# Patient Record
Sex: Female | Born: 1955 | Race: White | Hispanic: No | Smoking: Never smoker
Health system: Southern US, Community
[De-identification: ages and names within clinical notes are randomized; demographics above are authoritative.]

## PROBLEM LIST (undated history)

## (undated) DIAGNOSIS — K219 Gastro-esophageal reflux disease without esophagitis: Secondary | ICD-10-CM

## (undated) DIAGNOSIS — M1990 Unspecified osteoarthritis, unspecified site: Secondary | ICD-10-CM

## (undated) DIAGNOSIS — M62838 Other muscle spasm: Secondary | ICD-10-CM

## (undated) DIAGNOSIS — R51 Headache: Secondary | ICD-10-CM

## (undated) DIAGNOSIS — R519 Headache, unspecified: Secondary | ICD-10-CM

## (undated) DIAGNOSIS — E041 Nontoxic single thyroid nodule: Secondary | ICD-10-CM

## (undated) HISTORY — PX: TUBAL LIGATION: SHX77

## (undated) HISTORY — DX: Gastro-esophageal reflux disease without esophagitis: K21.9

## (undated) HISTORY — DX: Other muscle spasm: M62.838

## (undated) HISTORY — PX: OTHER SURGICAL HISTORY: SHX169

## (undated) HISTORY — PX: JOINT REPLACEMENT: SHX530

---

## 1997-02-07 HISTORY — PX: CORONARY ARTERY BYPASS GRAFT: SHX141

## 1997-02-07 HISTORY — PX: CHOLECYSTECTOMY: SHX55

## 1998-02-07 DIAGNOSIS — M539 Dorsopathy, unspecified: Secondary | ICD-10-CM | POA: Insufficient documentation

## 2004-05-05 ENCOUNTER — Ambulatory Visit: Payer: Self-pay | Admitting: Specialist

## 2004-05-19 ENCOUNTER — Ambulatory Visit: Payer: Self-pay | Admitting: Anesthesiology

## 2004-06-15 ENCOUNTER — Ambulatory Visit: Payer: Self-pay | Admitting: Anesthesiology

## 2004-07-28 ENCOUNTER — Ambulatory Visit: Payer: Self-pay | Admitting: Anesthesiology

## 2004-09-16 ENCOUNTER — Ambulatory Visit: Payer: Self-pay | Admitting: Obstetrics and Gynecology

## 2005-09-20 ENCOUNTER — Ambulatory Visit: Payer: Self-pay | Admitting: Obstetrics and Gynecology

## 2006-08-23 DIAGNOSIS — R209 Unspecified disturbances of skin sensation: Secondary | ICD-10-CM | POA: Insufficient documentation

## 2007-12-27 DIAGNOSIS — J329 Chronic sinusitis, unspecified: Secondary | ICD-10-CM | POA: Insufficient documentation

## 2007-12-27 DIAGNOSIS — G4700 Insomnia, unspecified: Secondary | ICD-10-CM | POA: Insufficient documentation

## 2009-03-26 DIAGNOSIS — L209 Atopic dermatitis, unspecified: Secondary | ICD-10-CM | POA: Insufficient documentation

## 2009-03-26 DIAGNOSIS — H6990 Unspecified Eustachian tube disorder, unspecified ear: Secondary | ICD-10-CM | POA: Insufficient documentation

## 2009-07-14 DIAGNOSIS — J309 Allergic rhinitis, unspecified: Secondary | ICD-10-CM | POA: Insufficient documentation

## 2011-05-03 ENCOUNTER — Ambulatory Visit: Payer: Self-pay | Admitting: Obstetrics and Gynecology

## 2011-07-13 LAB — HM COLONOSCOPY

## 2012-09-05 LAB — HM PAP SMEAR: HM PAP: NEGATIVE

## 2013-07-23 DIAGNOSIS — M2240 Chondromalacia patellae, unspecified knee: Secondary | ICD-10-CM | POA: Insufficient documentation

## 2013-11-06 LAB — CBC AND DIFFERENTIAL
HCT: 43 % (ref 36–46)
Hemoglobin: 14.7 g/dL (ref 12.0–16.0)
Platelets: 292 K/µL (ref 150–399)
WBC: 4.9 10*3/mL

## 2013-11-06 LAB — BASIC METABOLIC PANEL WITH GFR
BUN: 17 mg/dL (ref 4–21)
Creatinine: 0.9 mg/dL (ref 0.5–1.1)
Glucose: 102 mg/dL
Potassium: 4.4 mmol/L (ref 3.4–5.3)
Sodium: 141 mmol/L (ref 137–147)

## 2013-11-06 LAB — HEPATIC FUNCTION PANEL
ALT: 15 U/L (ref 7–35)
AST: 23 U/L (ref 13–35)

## 2013-11-06 LAB — LIPID PANEL
Cholesterol: 199 mg/dL (ref 0–200)
HDL: 65 mg/dL (ref 35–70)
LDL Cholesterol: 116 mg/dL
Triglycerides: 91 mg/dL (ref 40–160)

## 2013-11-06 LAB — HEMOGLOBIN A1C: Hgb A1c MFr Bld: 5.8 % (ref 4.0–6.0)

## 2014-05-14 ENCOUNTER — Ambulatory Visit
Admit: 2014-05-14 | Disposition: A | Discharge status: Home / Self Care | Payer: Self-pay | Attending: Family Medicine | Admitting: Family Medicine

## 2014-06-09 ENCOUNTER — Other Ambulatory Visit: Payer: Self-pay | Admitting: Orthopedic Surgery

## 2014-06-09 DIAGNOSIS — M542 Cervicalgia: Secondary | ICD-10-CM

## 2014-06-13 LAB — TSH: TSH: 0.81 u[IU]/mL (ref <=5.90)

## 2014-06-17 ENCOUNTER — Ambulatory Visit: Payer: Self-pay

## 2014-06-18 ENCOUNTER — Other Ambulatory Visit: Payer: Self-pay | Admitting: Family Medicine

## 2014-06-18 DIAGNOSIS — E041 Nontoxic single thyroid nodule: Secondary | ICD-10-CM

## 2014-06-20 ENCOUNTER — Ambulatory Visit: Payer: Self-pay

## 2014-06-25 ENCOUNTER — Ambulatory Visit
Admission: RE | Admit: 2014-06-25 | Discharge: 2014-06-25 | Disposition: A | Discharge status: Home / Self Care | Payer: BLUE CROSS/BLUE SHIELD | Source: Ambulatory Visit | Attending: Family Medicine | Admitting: Family Medicine

## 2014-06-25 DIAGNOSIS — E041 Nontoxic single thyroid nodule: Secondary | ICD-10-CM | POA: Diagnosis not present

## 2014-06-30 ENCOUNTER — Other Ambulatory Visit: Payer: Self-pay | Admitting: Otolaryngology

## 2014-06-30 DIAGNOSIS — E041 Nontoxic single thyroid nodule: Secondary | ICD-10-CM

## 2014-07-04 ENCOUNTER — Other Ambulatory Visit: Payer: Self-pay | Admitting: Physician Assistant

## 2014-07-08 ENCOUNTER — Ambulatory Visit
Admission: RE | Admit: 2014-07-08 | Discharge: 2014-07-08 | Disposition: A | Discharge status: Home / Self Care | Payer: BLUE CROSS/BLUE SHIELD | Source: Ambulatory Visit | Attending: Otolaryngology | Admitting: Otolaryngology

## 2014-07-08 DIAGNOSIS — R51 Headache: Secondary | ICD-10-CM | POA: Insufficient documentation

## 2014-07-08 DIAGNOSIS — E041 Nontoxic single thyroid nodule: Secondary | ICD-10-CM | POA: Diagnosis not present

## 2014-07-08 DIAGNOSIS — M1990 Unspecified osteoarthritis, unspecified site: Secondary | ICD-10-CM | POA: Diagnosis not present

## 2014-07-08 DIAGNOSIS — Z9049 Acquired absence of other specified parts of digestive tract: Secondary | ICD-10-CM | POA: Insufficient documentation

## 2014-07-08 HISTORY — DX: Headache, unspecified: R51.9

## 2014-07-08 HISTORY — DX: Nontoxic single thyroid nodule: E04.1

## 2014-07-08 HISTORY — DX: Headache: R51

## 2014-07-08 HISTORY — DX: Unspecified osteoarthritis, unspecified site: M19.90

## 2014-07-08 NOTE — Procedures (Signed)
FNA L lower pole nodule No comp

## 2014-07-11 LAB — CYTOLOGY - NON PAP

## 2014-07-27 ENCOUNTER — Other Ambulatory Visit: Payer: Self-pay | Admitting: Physician Assistant

## 2014-07-27 DIAGNOSIS — M545 Low back pain: Secondary | ICD-10-CM

## 2014-11-03 ENCOUNTER — Ambulatory Visit: Payer: BLUE CROSS/BLUE SHIELD | Admitting: Podiatry

## 2014-11-05 ENCOUNTER — Encounter: Payer: Self-pay | Admitting: Podiatry

## 2014-11-05 ENCOUNTER — Ambulatory Visit (INDEPENDENT_AMBULATORY_CARE_PROVIDER_SITE_OTHER): Payer: BLUE CROSS/BLUE SHIELD

## 2014-11-05 ENCOUNTER — Ambulatory Visit (INDEPENDENT_AMBULATORY_CARE_PROVIDER_SITE_OTHER): Payer: BLUE CROSS/BLUE SHIELD | Admitting: Podiatry

## 2014-11-05 VITALS — BP 113/78 | HR 83 | Resp 18

## 2014-11-05 DIAGNOSIS — R52 Pain, unspecified: Secondary | ICD-10-CM | POA: Diagnosis not present

## 2014-11-05 DIAGNOSIS — M778 Other enthesopathies, not elsewhere classified: Secondary | ICD-10-CM

## 2014-11-05 DIAGNOSIS — M7750 Other enthesopathy of unspecified foot: Secondary | ICD-10-CM | POA: Diagnosis not present

## 2014-11-05 DIAGNOSIS — M779 Enthesopathy, unspecified: Secondary | ICD-10-CM

## 2014-11-05 MED ORDER — MELOXICAM 15 MG PO TABS: 15.0000 mg | ORAL_TABLET | Freq: Every day | ORAL | Status: DC

## 2014-11-05 MED ORDER — METHYLPREDNISOLONE 4 MG PO TBPK: ORAL_TABLET | ORAL | Status: DC

## 2014-11-05 NOTE — Progress Notes (Signed)
   Subjective:    Patient ID: Ashley Wallace, female    DOB: 1955-08-12, 59 y.o.   MRN: [id number]  HPI I AM HAVING SOME PAIN ON BOTH OF MY FEET AND IT HAS BEEN GOING ON FOR ABOUT 6 MONTHS AND BURNS, THROBS, SORE AND TENDER AND NO SWELLING AND HURTS ON LEFT TOP AND BALL OF RIGHT HURTS AND TURNED A CARTWHEEL IN June AND HEARD A POP NEAR MY BUTT AND THE 2ND AND 3RD TOE SEPARATES ON THE LEFT FOOT. She states that the swelling continually gets worse and she has no stent increasing diastases between the third and fourth toes.    Review of Systems  All other systems reviewed and are negative.      Objective:   Physical Exam: 59 year old female presents in no acute distress vital signs stable alert and oriented 3. Pulses are strongly palpable bilateral. Neurologic sensorium is intact for Semmes-Weinstein monofilament. Deep tendon reflexes are intact bilateral muscle strength +5 over 5 dorsiflexion plantar flexors and inverters everters all digits musculature is intact. Orthopedic evaluation inserts all joints distal to the ankle for range of motion without crepitation. Cutaneous evaluation and states supple well-hydrated cutis no erythema edema cellulitis drainage or odor. She has a definite diastases with a palpable mass between the second third digits of the left foot. Medial deviation of the toe and lateral deviation of the third toe. She has pain with what appears to be fluctuance between the toes. The right foot is starting to develop some medial deviation as well. Radiographically she has what appears to be a dislocation of the third digit laterally. She also has medial deviation of the second digit with an elongated second metatarsal bilateral.        Assessment & Plan:  Capsulitis bilateral second metatarsophalangeal joint.  Plan: Discussed etiology pathology conservative versus surgical therapies. Started her on a Medrol Dosepak to be followed by meloxicam. Injected both feet at the  point of maximal tenderness. The right foot was injected with dexamethasone into the second metatarsophalangeal joint and the left was injected into the second interdigital space. We discussed appropriate shoe gear stretching exercises and ice therapy I will follow-up with her in 1 month.

## 2014-12-02 DIAGNOSIS — L309 Dermatitis, unspecified: Secondary | ICD-10-CM | POA: Insufficient documentation

## 2014-12-02 DIAGNOSIS — M62838 Other muscle spasm: Secondary | ICD-10-CM

## 2014-12-02 DIAGNOSIS — K219 Gastro-esophageal reflux disease without esophagitis: Secondary | ICD-10-CM

## 2014-12-02 DIAGNOSIS — R7303 Prediabetes: Secondary | ICD-10-CM | POA: Insufficient documentation

## 2014-12-02 DIAGNOSIS — W57XXXA Bitten or stung by nonvenomous insect and other nonvenomous arthropods, initial encounter: Secondary | ICD-10-CM | POA: Insufficient documentation

## 2014-12-02 DIAGNOSIS — B354 Tinea corporis: Secondary | ICD-10-CM | POA: Insufficient documentation

## 2014-12-02 DIAGNOSIS — M25519 Pain in unspecified shoulder: Secondary | ICD-10-CM | POA: Insufficient documentation

## 2014-12-02 DIAGNOSIS — E041 Nontoxic single thyroid nodule: Secondary | ICD-10-CM | POA: Insufficient documentation

## 2014-12-02 DIAGNOSIS — M539 Dorsopathy, unspecified: Secondary | ICD-10-CM | POA: Insufficient documentation

## 2014-12-02 HISTORY — DX: Other muscle spasm: M62.838

## 2014-12-02 HISTORY — DX: Gastro-esophageal reflux disease without esophagitis: K21.9

## 2014-12-04 ENCOUNTER — Ambulatory Visit (INDEPENDENT_AMBULATORY_CARE_PROVIDER_SITE_OTHER): Payer: BLUE CROSS/BLUE SHIELD | Admitting: Physician Assistant

## 2014-12-04 ENCOUNTER — Encounter: Payer: Self-pay | Admitting: Physician Assistant

## 2014-12-04 VITALS — BP 102/78 | HR 92 | Temp 98.3°F | Resp 16 | Ht 63.0 in | Wt 140.6 lb

## 2014-12-04 DIAGNOSIS — Z1239 Encounter for other screening for malignant neoplasm of breast: Secondary | ICD-10-CM | POA: Diagnosis not present

## 2014-12-04 DIAGNOSIS — G4700 Insomnia, unspecified: Secondary | ICD-10-CM | POA: Diagnosis not present

## 2014-12-04 DIAGNOSIS — E041 Nontoxic single thyroid nodule: Secondary | ICD-10-CM | POA: Diagnosis not present

## 2014-12-04 DIAGNOSIS — Z0000 Encounter for general adult medical examination without abnormal findings: Secondary | ICD-10-CM

## 2014-12-04 DIAGNOSIS — Z1211 Encounter for screening for malignant neoplasm of colon: Secondary | ICD-10-CM | POA: Diagnosis not present

## 2014-12-04 DIAGNOSIS — R7303 Prediabetes: Secondary | ICD-10-CM

## 2014-12-04 DIAGNOSIS — Z124 Encounter for screening for malignant neoplasm of cervix: Secondary | ICD-10-CM | POA: Diagnosis not present

## 2014-12-04 LAB — IFOBT (OCCULT BLOOD): IFOBT: NEGATIVE

## 2014-12-04 MED ORDER — ZOLPIDEM TARTRATE ER 6.25 MG PO TBCR: 6.2500 mg | EXTENDED_RELEASE_TABLET | Freq: Every evening | ORAL | Status: DC | PRN

## 2014-12-04 NOTE — Patient Instructions (Addendum)
Health Maintenance, Female Adopting a healthy lifestyle and getting preventive care can go a long way to promote health and wellness. Talk with your health care provider about what schedule of regular examinations is right for you. This is a good chance for you to check in with your provider about disease prevention and staying healthy. In between checkups, there are plenty of things you can do on your own. Experts have done a lot of research about which lifestyle changes and preventive measures are most likely to keep you healthy. Ask your health care provider for more information. WEIGHT AND DIET  Eat a healthy diet  Be sure to include plenty of vegetables, fruits, low-fat dairy products, and lean protein.  Do not eat a lot of foods high in solid fats, added sugars, or salt.  Get regular exercise. This is one of the most important things you can do for your health.  Most adults should exercise for at least 150 minutes each week. The exercise should increase your heart rate and make you sweat (moderate-intensity exercise).  Most adults should also do strengthening exercises at least twice a week. This is in addition to the moderate-intensity exercise.  Maintain a healthy weight  Body mass index (BMI) is a measurement that can be used to identify possible weight problems. It estimates body fat based on height and weight. Your health care provider can help determine your BMI and help you achieve or maintain a healthy weight.  For females 28 years of age and older:   A BMI below 18.5 is considered underweight.  A BMI of 18.5 to 24.9 is normal.  A BMI of 25 to 29.9 is considered overweight.  A BMI of 30 and above is considered obese.  Watch levels of cholesterol and blood lipids  You should start having your blood tested for lipids and cholesterol at 57 years of age, then have this test every 5 years.  You may need to have your cholesterol levels checked more often if:  Your lipid  or cholesterol levels are high.  You are older than 57 years of age.  You are at high risk for heart disease.  CANCER SCREENING   Lung Cancer  Lung cancer screening is recommended for adults 57-66 years old who are at high risk for lung cancer because of a history of smoking.  A yearly low-dose CT scan of the lungs is recommended for people who:  Currently smoke.  Have quit within the past 15 years.  Have at least a 30-pack-year history of smoking. A pack year is smoking an average of one pack of cigarettes a day for 1 year.  Yearly screening should continue until it has been 15 years since you quit.  Yearly screening should stop if you develop a health problem that would prevent you from having lung cancer treatment.  Breast Cancer  Practice breast self-awareness. This means understanding how your breasts normally appear and feel.  It also means doing regular breast self-exams. Let your health care provider know about any changes, no matter how small.  If you are in your 20s or 30s, you should have a clinical breast exam (CBE) by a health care provider every 1-3 years as part of a regular health exam.  If you are 25 or older, have a CBE every year. Also consider having a breast X-ray (mammogram) every year.  If you have a family history of breast cancer, talk to your health care provider about genetic screening.  If you  are at high risk for breast cancer, talk to your health care provider about having an MRI and a mammogram every year.  Breast cancer gene (BRCA) assessment is recommended for women who have family members with BRCA-related cancers. BRCA-related cancers include:  Breast.  Ovarian.  Tubal.  Peritoneal cancers.  Results of the assessment will determine the need for genetic counseling and BRCA1 and BRCA2 testing. Cervical Cancer Your health care provider may recommend that you be screened regularly for cancer of the pelvic organs (ovaries, uterus, and  vagina). This screening involves a pelvic examination, including checking for microscopic changes to the surface of your cervix (Pap test). You may be encouraged to have this screening done every 3 years, beginning at age 57.  For women ages 30-65, health care providers may recommend pelvic exams and Pap testing every 3 years, or they may recommend the Pap and pelvic exam, combined with testing for human papilloma virus (HPV), every 5 years. Some types of HPV increase your risk of cervical cancer. Testing for HPV may also be done on women of any age with unclear Pap test results.  Other health care providers may not recommend any screening for nonpregnant women who are considered low risk for pelvic cancer and who do not have symptoms. Ask your health care provider if a screening pelvic exam is right for you.  If you have had past treatment for cervical cancer or a condition that could lead to cancer, you need Pap tests and screening for cancer for at least 20 years after your treatment. If Pap tests have been discontinued, your risk factors (such as having a new sexual partner) need to be reassessed to determine if screening should resume. Some women have medical problems that increase the chance of getting cervical cancer. In these cases, your health care provider may recommend more frequent screening and Pap tests. Colorectal Cancer  This type of cancer can be detected and often prevented.  Routine colorectal cancer screening usually begins at 57 years of age and continues through 57 years of age.  Your health care provider may recommend screening at an earlier age if you have risk factors for colon cancer.  Your health care provider may also recommend using home test kits to check for hidden blood in the stool.  A small camera at the end of a tube can be used to examine your colon directly (sigmoidoscopy or colonoscopy). This is done to check for the earliest forms of colorectal  cancer.  Routine screening usually begins at age 50.  Direct examination of the colon should be repeated every 5-10 years through 57 years of age. However, you may need to be screened more often if early forms of precancerous polyps or small growths are found. Skin Cancer  Check your skin from head to toe regularly.  Tell your health care provider about any new moles or changes in moles, especially if there is a change in a mole's shape or color.  Also tell your health care provider if you have a mole that is larger than the size of a pencil eraser.  Always use sunscreen. Apply sunscreen liberally and repeatedly throughout the day.  Protect yourself by wearing long sleeves, pants, a wide-brimmed hat, and sunglasses whenever you are outside. HEART DISEASE, DIABETES, AND HIGH BLOOD PRESSURE   High blood pressure causes heart disease and increases the risk of stroke. High blood pressure is more likely to develop in:  People who have blood pressure in the high end   of the normal range (130-139/85-89 mm Hg).  People who are overweight or obese.  People who are African American.  If you are 38-23 years of age, have your blood pressure checked every 3-5 years. If you are 61 years of age or older, have your blood pressure checked every year. You should have your blood pressure measured twice--once when you are at a hospital or clinic, and once when you are not at a hospital or clinic. Record the average of the two measurements. To check your blood pressure when you are not at a hospital or clinic, you can use:  An automated blood pressure machine at a pharmacy.  A home blood pressure monitor.  If you are between 45 years and 39 years old, ask your health care provider if you should take aspirin to prevent strokes.  Have regular diabetes screenings. This involves taking a blood sample to check your fasting blood sugar level.  If you are at a normal weight and have a low risk for diabetes,  have this test once every three years after 57 years of age.  If you are overweight and have a high risk for diabetes, consider being tested at a younger age or more often. PREVENTING INFECTION  Hepatitis B  If you have a higher risk for hepatitis B, you should be screened for this virus. You are considered at high risk for hepatitis B if:  You were born in a country where hepatitis B is common. Ask your health care provider which countries are considered high risk.  Your parents were born in a high-risk country, and you have not been immunized against hepatitis B (hepatitis B vaccine).  You have HIV or AIDS.  You use needles to inject street drugs.  You live with someone who has hepatitis B.  You have had sex with someone who has hepatitis B.  You get hemodialysis treatment.  You take certain medicines for conditions, including cancer, organ transplantation, and autoimmune conditions. Hepatitis C  Blood testing is recommended for:  Everyone born from [birth year] through 1963.  Anyone with known risk factors for hepatitis C. Sexually transmitted infections (STIs)  You should be screened for sexually transmitted infections (STIs) including gonorrhea and chlamydia if:  You are sexually active and are younger than 57 years of age.  You are older than 57 years of age and your health care provider tells you that you are at risk for this type of infection.  Your sexual activity has changed since you were last screened and you are at an increased risk for chlamydia or gonorrhea. Ask your health care provider if you are at risk.  If you do not have HIV, but are at risk, it may be recommended that you take a prescription medicine daily to prevent HIV infection. This is called pre-exposure prophylaxis (PrEP). You are considered at risk if:  You are sexually active and do not regularly use condoms or know the HIV status of your partner(s).  You take drugs by injection.  You are sexually  active with a partner who has HIV. Talk with your health care provider about whether you are at high risk of being infected with HIV. If you choose to begin PrEP, you should first be tested for HIV. You should then be tested every 3 months for as long as you are taking PrEP.  PREGNANCY   If you are premenopausal and you may become pregnant, ask your health care provider about preconception counseling.  If you may  become pregnant, take 400 to 800 micrograms (mcg) of folic acid every day.  If you want to prevent pregnancy, talk to your health care provider about birth control (contraception). OSTEOPOROSIS AND MENOPAUSE   Osteoporosis is a disease in which the bones lose minerals and strength with aging. This can result in serious bone fractures. Your risk for osteoporosis can be identified using a bone density scan.  If you are 43 years of age or older, or if you are at risk for osteoporosis and fractures, ask your health care provider if you should be screened.  Ask your health care provider whether you should take a calcium or vitamin D supplement to lower your risk for osteoporosis.  Menopause may have certain physical symptoms and risks.  Hormone replacement therapy may reduce some of these symptoms and risks. Talk to your health care provider about whether hormone replacement therapy is right for you.  HOME CARE INSTRUCTIONS   Schedule regular health, dental, and eye exams.  Stay current with your immunizations.   Do not use any tobacco products including cigarettes, chewing tobacco, or electronic cigarettes.  If you are pregnant, do not drink alcohol.  If you are breastfeeding, limit how much and how often you drink alcohol.  Limit alcohol intake to no more than 1 drink per day for nonpregnant women. One drink equals 12 ounces of beer, 5 ounces of wine, or 1 ounces of hard liquor.  Do not use street drugs.  Do not share needles.  Ask your health care provider for help if  you need support or information about quitting drugs.  Tell your health care provider if you often feel depressed.  Tell your health care provider if you have ever been abused or do not feel safe at home.   This information is not intended to replace advice given to you by your health care provider. Make sure you discuss any questions you have with your health care provider.   Document Released: 08/09/2010 Document Revised: 02/14/2014 Document Reviewed: 12/26/2012 Elsevier Interactive Patient Education 2016 [employer]  Zolpidem extended-release tablets What is this medicine? ZOLPIDEM (zole PI dem) is used to treat insomnia. This medicine helps you to fall asleep and sleep through the night. This medicine may be used for other purposes; ask your health care provider or pharmacist if you have questions. What should I tell my health care provider before I take this medicine? They need to know if you have any of these conditions: -depression -history of drug abuse or addiction -if you often drink alcohol -liver disease -lung or breathing disease -myasthenia gravis -sleep apnea -suicidal thoughts, plans, or attempt; a previous suicide attempt by you or a family member -an unusual or allergic reaction to zolpidem, other medicines, foods, dyes, or preservatives -pregnant or trying to get pregnant -breast-feeding How should I use this medicine? Take this medicine by mouth with a glass of water. Follow the directions on the prescription label. Do not crush, split, or chew the tablet before swallowing. It is better to take this medicine on an empty stomach and only when you are ready for bed. Do not take your medicine more often than directed. If you have been taking this medicine for several weeks and suddenly stop taking it, you may get unpleasant withdrawal symptoms. Your doctor or health care professional may want to gradually reduce the dose. Do not stop taking this medicine on your own.  Always follow your doctor or health care professional's advice. A special MedGuide will  be given to you by the pharmacist with each prescription and refill. Be sure to read this information carefully each time. Talk to your pediatrician regarding the use of this medicine in children. Special care may be needed. Overdosage: If you think you have taken too much of this medicine contact a poison control center or emergency room at once. NOTE: This medicine is only for you. Do not share this medicine with others. What if I miss a dose? This does not apply. This medicine should only be taken immediately before going to sleep. Do not take double or extra doses. What may interact with this medicine? -alcohol -antihistamines for allergy, cough and cold -certain medicines for anxiety or sleep -certain medicines for depression, like amitriptyline, fluoxetine, sertraline -certain medicines for fungal infections like ketoconazole and itraconazole -certain medicines for seizures like phenobarbital, primidone -ciprofloxacin -dietary supplements for sleep, like valerian or kava kava -general anesthetics like halothane, isoflurane, methoxyflurane, propofol -local anesthetics like lidocaine, pramoxine, tetracaine -medicines that relax muscles for surgery -narcotic medicines for pain -phenothiazines like chlorpromazine, mesoridazine, prochlorperazine, thioridazine -rifampin This list may not describe all possible interactions. Give your health care provider a list of all the medicines, herbs, non-prescription drugs, or dietary supplements you use. Also tell them if you smoke, drink alcohol, or use illegal drugs. Some items may interact with your medicine. What should I watch for while using this medicine? Visit your doctor or health care professional for regular checks on your progress. Keep a regular sleep schedule by going to bed at about the same time each night. Avoid caffeine-containing drinks in the  evening hours. When sleep medicines are used every night for more than a few weeks, they may stop working. Talk to your doctor if your insomnia worsens or is not better within 7 to 10 days. After taking this medicine for sleep, you may get up out of bed while not being fully awake and do an activity that you do not know you are doing. The next morning, you may have no memory of the event. Activities such as driving a car ("sleep-driving"), making and eating food, talking on the phone, sexual activity, and sleep-walking have been reported. Call your doctor right away if you find out you have done any of these activities. Do not take this medicine if you have used alcohol that evening or before bed or taken another medicine for sleep since your risk of doing these sleep-related activities will be increased. Do not take this medicine unless you are able to stay in bed for a full night (7 to 8 hours) before you must be active again. Do not drive, use machinery, or do anything that needs mental alertness the day after you take this medicine. You may have a decrease in mental alertness the day after use, even if you feel that you are fully awake. Tell your doctor if you will need to perform activities requiring full alertness, such as driving, the next day. Do not stand or sit up quickly, especially if you are an older patient. This reduces the risk of dizzy or fainting spells. If you or your family notice any changes in your moods or behavior, such as new or worsening depression, thoughts of harming yourself, anxiety, other unusual or disturbing thoughts, or memory loss, call your doctor right away. After you stop taking this medicine, you may have trouble falling asleep. This is called rebound insomnia. This problem usually goes away on its own after 1 or 2 nights. What side  effects may I notice from receiving this medicine? Side effects that you should report to your doctor or health care professional as soon as  possible: -allergic reactions like skin rash, itching or hives, swelling of the face, lips, or tongue -breathing problems -changes in vision -confusion -depressed mood or other changes in moods or emotions -feeling faint or lightheaded, falls -hallucinations -loss of balance or coordination -loss of memory -restlessness, excitability, or feelings of anxiety or agitation -suicidal thoughts -unusual activities while asleep like driving, eating, making phone calls, or sexual activity Side effects that usually do not require medical attention (report to your doctor or health care professional if they continue or are bothersome): -dizziness -drowsiness the day after you take this medicine -headache This list may not describe all possible side effects. Call your doctor for medical advice about side effects. You may report side effects to FDA at 1-800-FDA-1088. Where should I keep my medicine? Keep out of the reach of children. This medicine can be abused. Keep your medicine in a safe place to protect it from theft. Do not share this medicine with anyone. Selling or giving away this medicine is dangerous and against the law. This medicine may cause accidental overdose and death if taken by other adults, children, or pets. Mix any unused medicine with a substance like cat litter or coffee grounds. Then throw the medicine away in a sealed container like a sealed bag or a coffee can with a lid. Do not use the medicine after the expiration date. Store at controlled room temperature between 15 and 25 degrees C (59 and 77 degrees F). NOTE: This sheet is a summary. It may not cover all possible information. If you have questions about this medicine, talk to your doctor, pharmacist, or health care provider.    2016, Elsevier/[employer] Standard. (2014-09-29 16:48:57)

## 2014-12-04 NOTE — Progress Notes (Signed)
Patient: Ashley ArdsMargaret O Harden, Female    DOB: April 05, 1955, 59 y.o.   MRN: [id number] Visit Date: 12/04/2014  Today's Provider: Margaretann LovelessJennifer M Burnette, PA-C   Chief Complaint  Patient presents with  . Annual Exam   Subjective:    Annual physical exam Ashley Wallace is a 59 y.o. female who presents today for health maintenance and complete physical. She feels fairly well. She reports exercising, tries to walk everyday and rides her bike 3-4 times a weeks. She reports she is sleeping poorly, total hours of sleep 4-5. Declined flu vaccine. Previous Pap smear was normal. She has never had an abnormal Pap smear. She is postmenopausal. There is no family history of cervical or ovarian cancer. She cannot remember when her last mammogram was. She states that they have never been abnormal. There is a positive family history of breast cancer in a maternal aunt. Her aunt did pass away from breast cancer. She believes her and was in her 7850s when she was diagnosed. Previous colonoscopy in 2013 with results stated as below. She is to have a repeat colonoscopy in 10 years which would be 2023. There is no family history of colon cancer.   Last PCP:11/01/13 Colonoscopy: 07/13/11 Internal Hemorrhoids Diverticulosis, Hyperplastic polyps. Repeat in 10 years (2023). Pap. 09/05/2012 Normal. HPV Negative.      Review of Systems  Constitutional: Negative.   HENT: Negative.   Eyes: Negative.   Respiratory: Negative.   Cardiovascular: Negative.   Gastrointestinal: Negative.   Endocrine: Negative.   Genitourinary: Negative.   Musculoskeletal: Positive for myalgias, arthralgias and neck pain (followed by Dr. Shon BatonBrooks).  Skin: Negative.   Allergic/Immunologic: Negative.   Neurological: Negative.   Hematological: Negative.   Psychiatric/Behavioral: Negative.     Social History She  reports that she has never smoked. She does not have any smokeless tobacco history on file. She reports that she does  not drink alcohol or use illicit drugs. Social History   Social History  . Marital Status: Married    Spouse Name: N/A  . Number of Children: N/A  . Years of Education: N/A   Social History Main Topics  . Smoking status: Never Smoker   . Smokeless tobacco: None  . Alcohol Use: No  . Drug Use: No  . Sexual Activity: Not Asked   Other Topics Concern  . None   Social History Narrative    Patient Active Problem List   Diagnosis Date Noted  . Dermatitis, eczematoid 12/02/2014  . GERD (gastroesophageal reflux disease) 12/02/2014  . 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 Pre-diabetes 12/02/2014  . Pain in shoulder 12/02/2014  . Thyroid nodule 12/02/2014  . Tinea corporis 12/02/2014  . Muscle spasms of neck 12/02/2014  . Tick bite 12/02/2014  . 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 Allergic rhinitis 07/14/2009  . Disorder of eustachian tube 03/26/2009  . AD (atopic dermatitis) 03/26/2009  . Chronic infection of sinus 12/27/2007  . Insomnia 12/27/2007  . Disturbance of skin sensation 08/23/2006  . Multilevel degenerative disc disease 02/07/1998    Past Surgical History  Procedure Laterality Date  . Cholecystectomy    . Coronary artery bypass graft  1999    Family History  Family Status  Relation Status Death Age  . Mother Deceased [id number]    cause of death was Failure To Thrive  . Father Deceased     Cause of Death was cancer-had a brain tumor, lung cancer and Emphysema   Her family history includes  Diabetes in her mother; Hypertension in her mother.    No Known Allergies  Previous Medications   IBUPROFEN ([facility] IBUPROFEN IB) 200 MG TABLET    Take by mouth.   LORATADINE (CLARITIN) 10 MG TABLET    Take 10 mg by mouth daily.   MELOXICAM (MOBIC) 15 MG TABLET    Take 1 tablet (15 mg total) by mouth daily.   MULTIPLE VITAMIN (MULTIVITAMIN) TABLET    Take 1 tablet by mouth daily.   TRAZODONE (DESYREL) 50 MG TABLET    Take 50 mg by mouth at bedtime.    Patient Care Team: Maple Hudson., MD as PCP - General (Family Medicine)     Objective:   Vitals: BP 102/78 mmHg  Pulse 92  Temp(Src) 98.3 F (36.8 C) (Oral)  Resp 16  Ht  (1.6 m)  Wt 140 lb 9.6 oz (63.776 kg)  BMI 24.91 kg/m2   Physical Exam  Constitutional: She is oriented to person, place, and time. She appears well-developed and well-nourished. No distress.  HENT:  Head: Normocephalic and atraumatic.  Right Ear: Hearing, tympanic membrane, external ear and ear canal normal.  Left Ear: Hearing, tympanic membrane, external ear and ear canal normal.  Nose: Nose normal.  Mouth/Throat: Uvula is midline, oropharynx is clear and moist and mucous membranes are normal. No oropharyngeal exudate.  Eyes: Conjunctivae and EOM are normal. Pupils are equal, round, and reactive to light. Right eye exhibits no discharge. Left eye exhibits no discharge. No scleral icterus.  Neck: Normal range of motion. Neck supple. No JVD present. Carotid bruit is not present. No tracheal deviation present. Thyroid mass (small palpable area over left wing of thyroid most likely scar tissue from biopsy over the thyroid nodule) present. No thyromegaly present.  Cardiovascular: Normal rate, regular rhythm, normal heart sounds and intact distal pulses.  Exam reveals no gallop and no friction rub.   No murmur heard. Pulmonary/Chest: Effort normal and breath sounds normal. No respiratory distress. She has no wheezes. She has no rales. She exhibits no tenderness. Right breast exhibits no inverted nipple, no mass, no nipple discharge, no skin change and no tenderness. Left breast exhibits no inverted nipple, no mass, no nipple discharge, no skin change and no tenderness. Breasts are symmetrical.  Abdominal: Soft. Bowel sounds are normal. She exhibits no distension and no mass. There is no tenderness. There is no rebound and no guarding. Hernia confirmed negative in the right inguinal area and confirmed negative in the left inguinal area.    Genitourinary: Vagina normal and uterus normal. Rectal exam shows external hemorrhoid and internal hemorrhoid. Rectal exam shows no fissure, no mass, no tenderness and anal tone normal. Guaiac negative stool. No breast swelling, tenderness, discharge or bleeding. Pelvic exam was performed with patient supine. There is no rash, tenderness, lesion or injury on the right labia. There is no rash, tenderness, lesion or injury on the left labia. Cervix exhibits no motion tenderness, no discharge and no friability. Right adnexum displays no mass, no tenderness and no fullness. Left adnexum displays no mass, no tenderness and no fullness. No erythema, tenderness or bleeding in the vagina. No signs of injury around the vagina. No vaginal discharge found.  Musculoskeletal: Normal range of motion. She exhibits no edema or tenderness.  Lymphadenopathy:    She has no cervical adenopathy.       Right: No inguinal adenopathy present.       Left: No inguinal adenopathy present.  Neurological: She is  alert and oriented to person, place, and time. She has normal reflexes. No cranial nerve deficit. Coordination normal.  Skin: Skin is warm and dry. No rash noted. She is not diaphoretic.  Psychiatric: She has a normal mood and affect. Her behavior is normal. Judgment and thought content normal.  Vitals reviewed.    Depression Screen No flowsheet data found.    Assessment & Plan:     Routine Health Maintenance and Physical Exam  1. Annual physical exam Physical exam was normal today. We will check labs as below and follow-up pending lab results. If labs are stable and within normal limits I will see her back in one year for repeat physical exam. - CBC with Differential - Comprehensive metabolic panel - Lipid panel  2. Breast cancer screening Breast exam was normal today. She does not perform regular monthly breast exams but does them on occasion. Screening mammogram order has been placed. She was given the  phone number for [facility] breast clinic so that she may call to schedule this appointment at her discretion. - Mammogram Digital Screening; Future  3. Colon cancer screening OC-Lite was negative today. Colonoscopy will be due in 2023. - IFOBT POC (occult bld, rslt in office)  4. Cervical cancer screening Pap collected today. We will follow-up pending results of the Pap smear. If Pap is normal will see her back in one year for repeat physical exam. - Pap IG w/ reflex to HPV when [facility] ([facility])  5. Thyroid nodule Incidentally found on neck MRI. She did have a thyroid ultrasound and biopsy done by Dr. Willeen Cass. I will check her TSH today since it has not been checked recently. We will follow-up pending results. If TSH is normal she may follow-up with Dr. Willeen Cass in November as already scheduled. - TSH  6. Pre-diabetes Last hemoglobin A1c was elevated at 5.8. We will recheck hemoglobin A1c today and follow-up pending results. If lab is stable we will follow-up in one year with repeat physical exam. - HgB A1c  7. Insomnia Worsening. She has previously been on trazodone 50 mg and states that this has not been working well. She states she will follow sleep for approximately 2 hours and then be awake. She has tried Ambien in the past and stated that it worked well but it was very expensive at the time. She is interested in trying the new controlled-release Ambien to see if this helps her stay asleep throughout the night without waking. I will prescribe Ambien controlled release 6.25 mg as below. She is to call the office if this medication does not work well. - zolpidem (AMBIEN CR) 6.25 MG CR tablet; Take 1 tablet (6.25 mg total) by mouth at bedtime as needed for sleep.  Dispense: 30 tablet; Refill: 1   Exercise Activities and Dietary recommendations Goals    None      Immunization History  Administered Date(s) Administered  . Tdap 09/05/2012    Health Maintenance  Topic Date  Due  . Hepatitis C Screening  July 15, 1955  . HIV Screening  07/28/1972  . MAMMOGRAM  07/29/2007  . INFLUENZA VACCINE  09/08/2014  . PAP SMEAR  09/06/2015  . COLONOSCOPY  07/12/2021  . TETANUS/TDAP  09/06/2022      Discussed health benefits of physical activity, and encouraged her to engage in regular exercise appropriate for her age and condition.    --------------------------------------------------------------------

## 2014-12-06 LAB — COMPREHENSIVE METABOLIC PANEL
A/G RATIO: 2.2 (ref 1.1–2.5)
ALT: 15 IU/L (ref 0–32)
AST: 24 IU/L (ref 0–40)
Albumin: 4.1 g/dL (ref 3.5–5.5)
Alkaline Phosphatase: 74 IU/L (ref 39–117)
BUN/Creatinine Ratio: 23 (ref 9–23)
BUN: 18 mg/dL (ref 6–24)
Bilirubin Total: 0.8 mg/dL (ref 0.0–1.2)
CALCIUM: 9.4 mg/dL (ref 8.7–10.2)
CO2: 24 mmol/L (ref 18–29)
CREATININE: 0.8 mg/dL (ref 0.57–1.00)
Chloride: 104 mmol/L (ref 97–106)
GFR, EST AFRICAN AMERICAN: 95 mL/min/{1.73_m2} (ref >59)
GFR, EST NON AFRICAN AMERICAN: 82 mL/min/{1.73_m2} (ref >59)
GLOBULIN, TOTAL: 1.9 g/dL (ref 1.5–4.5)
Glucose: 91 mg/dL (ref 65–99)
Potassium: 4.8 mmol/L (ref 3.5–5.2)
SODIUM: 141 mmol/L (ref 136–144)
TOTAL PROTEIN: 6 g/dL (ref 6.0–8.5)

## 2014-12-06 LAB — LIPID PANEL
CHOL/HDL RATIO: 2.7 ratio (ref 0.0–4.4)
Cholesterol, Total: 206 mg/dL — ABNORMAL HIGH (ref 100–199)
HDL: 76 mg/dL (ref >39)
LDL CALC: 117 mg/dL — AB (ref 0–99)
TRIGLYCERIDES: 65 mg/dL (ref 0–149)
VLDL Cholesterol Cal: 13 mg/dL (ref 5–40)

## 2014-12-06 LAB — CBC WITH DIFFERENTIAL/PLATELET
BASOS: 1 %
Basophils Absolute: 0 10*3/uL (ref 0.0–0.2)
EOS (ABSOLUTE): 0.5 10*3/uL — ABNORMAL HIGH (ref 0.0–0.4)
Eos: 9 %
HEMATOCRIT: 41.1 % (ref 34.0–46.6)
HEMOGLOBIN: 13.6 g/dL (ref 11.1–15.9)
IMMATURE GRANS (ABS): 0 10*3/uL (ref 0.0–0.1)
Immature Granulocytes: 0 %
LYMPHS: 30 %
Lymphocytes Absolute: 1.6 10*3/uL (ref 0.7–3.1)
MCH: 28.6 pg (ref 26.6–33.0)
MCHC: 33.1 g/dL (ref 31.5–35.7)
MCV: 86 fL (ref 79–97)
MONOCYTES: 9 %
Monocytes Absolute: 0.5 10*3/uL (ref 0.1–0.9)
NEUTROS PCT: 51 %
Neutrophils Absolute: 2.7 10*3/uL (ref 1.4–7.0)
Platelets: 300 10*3/uL (ref 150–379)
RBC: 4.76 x10E6/uL (ref 3.77–5.28)
RDW: 13.8 % (ref 12.3–15.4)
WBC: 5.2 10*3/uL (ref 3.4–10.8)

## 2014-12-06 LAB — TSH: TSH: 0.982 u[IU]/mL (ref 0.450–4.500)

## 2014-12-06 LAB — HEMOGLOBIN A1C
ESTIMATED AVERAGE GLUCOSE: 123 mg/dL
Hgb A1c MFr Bld: 5.9 % — ABNORMAL HIGH (ref 4.8–5.6)

## 2014-12-08 ENCOUNTER — Encounter: Payer: Self-pay | Admitting: Podiatry

## 2014-12-08 ENCOUNTER — Telehealth: Payer: Self-pay

## 2014-12-08 ENCOUNTER — Ambulatory Visit (INDEPENDENT_AMBULATORY_CARE_PROVIDER_SITE_OTHER): Payer: BLUE CROSS/BLUE SHIELD | Admitting: Podiatry

## 2014-12-08 VITALS — BP 125/87 | HR 69 | Resp 12

## 2014-12-08 DIAGNOSIS — M722 Plantar fascial fibromatosis: Secondary | ICD-10-CM | POA: Diagnosis not present

## 2014-12-08 DIAGNOSIS — M778 Other enthesopathies, not elsewhere classified: Secondary | ICD-10-CM

## 2014-12-08 DIAGNOSIS — M7750 Other enthesopathy of unspecified foot: Secondary | ICD-10-CM

## 2014-12-08 DIAGNOSIS — M779 Enthesopathy, unspecified: Principal | ICD-10-CM

## 2014-12-08 MED ORDER — DICLOFENAC SODIUM 75 MG PO TBEC: 75.0000 mg | DELAYED_RELEASE_TABLET | Freq: Two times a day (BID) | ORAL | Status: DC

## 2014-12-08 NOTE — Telephone Encounter (Signed)
-----   Message from Margaretann LovelessJennifer M Burnette, PA-C sent at 12/08/2014  8:35 AM EDT ----- Labs are stable and WNL with exception of cholesterol.  Total cholesterol is borderline high at 206 (beloww 200 is normal), LDL is also borderline high at 117 (like below 100).  HDL however is elevated at 76 which offers cardioprotection.  HgBA1c was also slightly elevated at 5.9.  Would like below 5.7.  Limit sweets in diet.  We will recheck labs in one year.

## 2014-12-08 NOTE — Progress Notes (Signed)
She presents today for follow-up of her capsulitis second metatarsophalangeal joints bilateral. She states that they seem to be doing much better other than the spasms that she has been getting from taking the meloxicam. She also relates some minor heel pain.  Objective: Vital signs are stable she is alert and oriented 3 no reproducible pain today other than some tenderness of the plantar fasciitis insertion site bilateral heels.  Assessment: Well-healing capsulitis second metatarsophalangeal joints bilateral plantar fasciitis present bilateral.  Plan: Discussed etiology pathology conservative versus surgical therapies. Currently I started her on diclofenac 75 mg 1 by mouth twice a day and will consider bilateral plantar fascial injections next visit.  Ashley Wallace DPM

## 2014-12-08 NOTE — Telephone Encounter (Signed)
Patient advised as directed below.  Thanks,  -Vandella Ord 

## 2014-12-08 NOTE — Telephone Encounter (Signed)
LMTCB  Thanks,  -Launi Asencio 

## 2014-12-09 LAB — PAP IG W/ RFLX HPV ASCU: PAP Smear Comment: 0

## 2014-12-12 ENCOUNTER — Telehealth: Payer: Self-pay

## 2014-12-12 NOTE — Telephone Encounter (Signed)
Patient advised as directed below.  Thanks,  -Keslyn Teater 

## 2014-12-12 NOTE — Telephone Encounter (Signed)
-----   Message from Margaretann LovelessJennifer M Burnette, [place] sent at 12/11/2014  3:18 PM EDT ----- Normal pap

## 2014-12-12 NOTE — Telephone Encounter (Signed)
LMTCB  12/12/14  Thanks,  -Solveig Fangman

## 2014-12-26 ENCOUNTER — Other Ambulatory Visit: Payer: Self-pay | Admitting: Otolaryngology

## 2014-12-26 DIAGNOSIS — E041 Nontoxic single thyroid nodule: Secondary | ICD-10-CM

## 2015-01-07 ENCOUNTER — Encounter: Payer: Self-pay | Admitting: Podiatry

## 2015-01-07 ENCOUNTER — Ambulatory Visit (INDEPENDENT_AMBULATORY_CARE_PROVIDER_SITE_OTHER): Payer: BLUE CROSS/BLUE SHIELD | Admitting: Podiatry

## 2015-01-07 VITALS — BP 128/84 | HR 89 | Resp 16

## 2015-01-07 DIAGNOSIS — M7750 Other enthesopathy of unspecified foot: Secondary | ICD-10-CM | POA: Diagnosis not present

## 2015-01-07 DIAGNOSIS — M778 Other enthesopathies, not elsewhere classified: Secondary | ICD-10-CM

## 2015-01-07 DIAGNOSIS — M722 Plantar fascial fibromatosis: Secondary | ICD-10-CM

## 2015-01-07 DIAGNOSIS — M779 Enthesopathy, unspecified: Principal | ICD-10-CM

## 2015-01-07 MED ORDER — DICLOFENAC SODIUM 1 % TD GEL: 4.0000 g | Freq: Four times a day (QID) | TRANSDERMAL | Status: DC

## 2015-01-07 NOTE — Progress Notes (Signed)
She presents today for follow-up of capsulitis second metatarsophalangeal joints bilateral. She states that the plantar fasciitis is doing much better and the capsulitis has improved considerably. She skin no longer take the diclofenac causes stomach upset. She states that she is approximately 90% improved as far as the capsulitis goes.  Objective: Vital signs are stable she is alert and oriented 3 pulses are palpable. Neurologic sensorium is intact. She has minimal pain on palpation with less induration plantar aspect of the second metatarsophalangeal joints bilateral.  Assessment: Resolving capsulitis plantar fasciitis bilateral.  Plan: Discontinue diclofenac by mouth and start diclofenac gel external. Follow up with her in 1 month consider orthotics if necessary.

## 2015-01-08 ENCOUNTER — Telehealth: Payer: Self-pay | Admitting: *Deleted

## 2015-01-08 NOTE — Telephone Encounter (Signed)
Pt request that the gel Dr. Al CorpusHyatt ordered be changed since it is not covered by her insurance.

## 2015-01-12 MED ORDER — NONFORMULARY OR COMPOUNDED ITEM: Status: DC

## 2015-01-12 NOTE — Telephone Encounter (Signed)
I don't think there is any other topical option but feel free to look. I think the oral was upsetting to her stomach.

## 2015-01-12 NOTE — Telephone Encounter (Signed)
Do the first one.  Thanks.

## 2015-01-12 NOTE — Telephone Encounter (Addendum)
Dr. Al CorpusHyatt, [facility] has an Antiinflammatory cream:  Diclofenac 3%, Baclofen 2%, Cyclobenzaprine 2%, Lidocaine 2% 120 gram 1-2 grams to affected area 3-4 times daily.  Also they have an alternative if above is not covered, Ibuprofen 15%, Baclofen 1%, Gabapentin 3%, Lidocaine 2% if insurance does not cover.  Please advise.  Ashley SanValery  Orders to pt and faxed with pt data and insurance information.

## 2015-01-13 ENCOUNTER — Telehealth: Payer: Self-pay

## 2015-01-13 ENCOUNTER — Ambulatory Visit
Admission: RE | Admit: 2015-01-13 | Discharge: 2015-01-13 | Disposition: A | Discharge status: Home / Self Care | Payer: BLUE CROSS/BLUE SHIELD | Source: Ambulatory Visit | Attending: Physician Assistant | Admitting: Physician Assistant

## 2015-01-13 DIAGNOSIS — Z1231 Encounter for screening mammogram for malignant neoplasm of breast: Secondary | ICD-10-CM | POA: Diagnosis not present

## 2015-01-13 DIAGNOSIS — Z1239 Encounter for other screening for malignant neoplasm of breast: Secondary | ICD-10-CM

## 2015-01-13 NOTE — Telephone Encounter (Signed)
Patient advised as directed below.  Thanks,  -Joseline 

## 2015-01-13 NOTE — Telephone Encounter (Signed)
LMTCB  Thanks,  -Ethaniel Garfield 

## 2015-01-13 NOTE — Telephone Encounter (Signed)
-----   Message from Margaretann LovelessJennifer M Burnette, [place] sent at 01/13/2015  1:05 PM EST ----- Normal mammogram. Repeat screening in one year.

## 2015-02-04 ENCOUNTER — Ambulatory Visit: Payer: BLUE CROSS/BLUE SHIELD | Admitting: Podiatry

## 2015-03-19 ENCOUNTER — Ambulatory Visit (INDEPENDENT_AMBULATORY_CARE_PROVIDER_SITE_OTHER): Payer: BLUE CROSS/BLUE SHIELD | Admitting: Physician Assistant

## 2015-03-19 ENCOUNTER — Encounter: Payer: Self-pay | Admitting: Physician Assistant

## 2015-03-19 VITALS — BP 104/78 | HR 75 | Temp 98.2°F | Resp 16 | Wt 137.0 lb

## 2015-03-19 DIAGNOSIS — R319 Hematuria, unspecified: Secondary | ICD-10-CM | POA: Diagnosis not present

## 2015-03-19 DIAGNOSIS — R350 Frequency of micturition: Secondary | ICD-10-CM | POA: Diagnosis not present

## 2015-03-19 DIAGNOSIS — J302 Other seasonal allergic rhinitis: Secondary | ICD-10-CM

## 2015-03-19 LAB — POCT URINALYSIS DIPSTICK
Bilirubin, UA: NEGATIVE
Glucose, UA: NEGATIVE
Ketones, UA: NEGATIVE
LEUKOCYTES UA: NEGATIVE
Nitrite, UA: NEGATIVE
PH UA: 7
PROTEIN UA: NEGATIVE
Spec Grav, UA: 1.01
UROBILINOGEN UA: 0.2

## 2015-03-19 MED ORDER — FLUTICASONE PROPIONATE 50 MCG/ACT NA SUSP: 2.0000 | Freq: Every day | NASAL | Status: DC

## 2015-03-19 MED ORDER — SULFAMETHOXAZOLE-TRIMETHOPRIM 800-160 MG PO TABS: 1.0000 | ORAL_TABLET | Freq: Two times a day (BID) | ORAL | Status: DC

## 2015-03-19 NOTE — Patient Instructions (Signed)

## 2015-03-19 NOTE — Progress Notes (Signed)
Patient: Ashley Wallace Female    DOB: [birth year]   60 y.o.   MRN: [id number] Visit Date: 03/19/2015  Today's Provider: [name], PA-C   Chief Complaint  Patient presents with  . Urinary Tract Infection   Subjective:    Urinary Tract Infection  This is a new problem. The current episode started 1 to 4 weeks ago. Episode frequency: When she urinates it doesn't hurt or burn. The problem has been gradually worsening. The patient is experiencing no pain. There has been no fever. Associated symptoms include frequency and urgency. Pertinent negatives include no chills, discharge, flank pain, hematuria, hesitancy, nausea or vomiting. Associated symptoms comments: Very dark in color and the bad odor.. She has tried increased fluids for the symptoms. The treatment provided no relief.  She has never had a kidney stone nor has she ever had a UTI to her knowledge.    No Known Allergies Previous Medications   DICLOFENAC (VOLTAREN) 75 MG EC TABLET    Take 1 tablet (75 mg total) by mouth 2 (two) times daily.   DICLOFENAC SODIUM (VOLTAREN) 1 % GEL    Apply 4 g topically 4 (four) times daily.   IBUPROFEN ([facility] IBUPROFEN IB) 200 MG TABLET    Take by mouth.   LORATADINE (CLARITIN) 10 MG TABLET    Take 10 mg by mouth daily.   MELOXICAM (MOBIC) 15 MG TABLET    Take 1 tablet (15 mg total) by mouth daily.   MULTIPLE VITAMIN (MULTIVITAMIN) TABLET    Take 1 tablet by mouth daily.   NONFORMULARY OR COMPOUNDED ITEM    [facility] compound - Anti inflammatory Cream:  Diclofenac 3%, Baclofen 2%, Cyclobenzaprine 2%, Lidocaine 2% dispense 120 gram apply 1-2 grams to affected area 3-4 times daily, +2refills.   TRAZODONE (DESYREL) 50 MG TABLET    Take 50 mg by mouth at bedtime.   ZOLPIDEM (AMBIEN CR) 6.25 MG CR TABLET    Take 1 tablet (6.25 mg total) by mouth at bedtime as needed for sleep.    Review of Systems  Constitutional: Negative for fever and chills.  Respiratory: Negative.     Cardiovascular: Negative.   Gastrointestinal: Positive for abdominal pain (sometimes hurts on her right lower quadrant). Negative for nausea and vomiting.  Genitourinary: Positive for urgency and frequency. Negative for dysuria, hesitancy, hematuria, flank pain, vaginal discharge, vaginal pain and pelvic pain.  Musculoskeletal: Negative for back pain.    Social History  Substance Use Topics  . Smoking status: Never Smoker   . Smokeless tobacco: Not on file  . Alcohol Use: No   Objective:   BP 104/78 mmHg  Pulse 75  Temp(Src) 98.2 F (36.8 C) (Oral)  Resp 16  Wt 137 lb (62.143 kg)  Physical Exam  Constitutional: She is oriented to person, place, and time. She appears well-developed and well-nourished. No distress.  Cardiovascular: Normal rate, regular rhythm and normal heart sounds.  Exam reveals no gallop and no friction rub.   No murmur heard. Pulmonary/Chest: Effort normal and breath sounds normal. No respiratory distress. She has no wheezes. She has no rales.  Abdominal: Soft. Normal appearance and bowel sounds are normal. She exhibits no distension and no mass. There is no hepatosplenomegaly. There is generalized tenderness (right side more than left; RLQ most tender). There is no rebound, no guarding and no CVA tenderness.  Neurological: She is alert and oriented to person, place, and time.  Skin: Skin is warm and  dry. She is not diaphoretic.        Assessment & Plan:     1. Frequent urination UA was negative for leukocytes or nitrites but did have trace hematuria. I will send for urine culture due to her symptoms. I will treat proactively and empirically with Bactrim as below. I will adjust or discontinue antibiotic therapy depending on culture and sensitivity results. I did discuss with her that this could possibly be a urinary tract infection but that I did have other things that could possibly be going on. We did discuss possible ovarian cyst since she does still have  her ovaries and was most tender over the right lower quadrant. She did have a history of ovarian cyst when she was in her 58s and states this pain does not feel similar to that pain. We also discussed that she may be starting to develop an overactive bladder. I did discuss mechanism of overactive bladder with her and treatments that we would use if that were the case. We may consider this in the future if her urine culture comes back negative. The other thing that we discussed was constipation. She does have a long-standing history of constipation but has never had the urinary symptoms that can, constipation. She states that a few years ago she was put on this but only took it for 4 weeks and discontinued due to diarrhea. She does mention that she cannot have a bowel movement without taking a laxative to help. I did advise her that this could very well be causing her urinary frequency. We discussed that if her urine culture does come back negative with no growth we will discontinue the antibiotic and may consider a trial of amitiza for one month. She agrees with this treatment plan. I will follow-up with her pending the urine culture results and go from there with treatment. She is to call the office if her symptoms worsen. - POCT urinalysis dipstick - Urine culture - sulfamethoxazole-trimethoprim (BACTRIM DS,SEPTRA DS) 800-160 MG tablet; Take 1 tablet by mouth 2 (two) times daily.  Dispense: 10 tablet; Refill: 0  2. Other seasonal allergic rhinitis Currently stable. Medication pulled for refill. Continue current medical treatment plan. - fluticasone (FLONASE) 50 MCG/ACT nasal spray; Place 2 sprays into both nostrils daily.  Dispense: 16 g; Refill: 6  3. Hematuria See above medical treatment plan for #1. - sulfamethoxazole-trimethoprim (BACTRIM DS,SEPTRA DS) 800-160 MG tablet; Take 1 tablet by mouth 2 (two) times daily.  Dispense: 10 tablet; Refill: 0       [name], PA-C  [facility]

## 2015-03-21 LAB — URINE CULTURE: Organism ID, Bacteria: NO GROWTH

## 2015-03-23 ENCOUNTER — Telehealth: Payer: Self-pay

## 2015-03-23 NOTE — Telephone Encounter (Signed)
-----   Message from [name], PA-C sent at 03/21/2015  9:10 AM EST ----- No growth. May discontinue antibiotic. Consider constipation treatment as discussed at visit.

## 2015-03-23 NOTE — Telephone Encounter (Signed)
Patient advised as directed below.  Thanks,  -Joseline 

## 2015-03-23 NOTE — Telephone Encounter (Signed)
LMTCB  Thanks,  -Makalah Asberry 

## 2015-03-27 ENCOUNTER — Other Ambulatory Visit: Payer: Self-pay | Admitting: Physician Assistant

## 2015-03-27 DIAGNOSIS — G4700 Insomnia, unspecified: Secondary | ICD-10-CM

## 2015-03-27 NOTE — Telephone Encounter (Signed)
Rx for Zolpidem called to [facility].  Thanks,  -Joseline

## 2015-06-09 ENCOUNTER — Ambulatory Visit
Admission: RE | Admit: 2015-06-09 | Discharge: 2015-06-09 | Disposition: A | Discharge status: Home / Self Care | Payer: BLUE CROSS/BLUE SHIELD | Source: Ambulatory Visit | Attending: Otolaryngology | Admitting: Otolaryngology

## 2015-06-09 ENCOUNTER — Other Ambulatory Visit: Payer: Self-pay | Admitting: Otolaryngology

## 2015-06-09 DIAGNOSIS — E041 Nontoxic single thyroid nodule: Secondary | ICD-10-CM

## 2015-07-27 ENCOUNTER — Telehealth: Payer: Self-pay | Admitting: *Deleted

## 2015-07-27 NOTE — Telephone Encounter (Addendum)
Refill request for Antiinflammatory pain cream.  Dr. Al CorpusHyatt refilled +6, pt needs an appt. [facility]  Left message informing pt she had 6 more refills and that if she was continuing to have problems to make an appt.

## 2015-10-16 ENCOUNTER — Encounter: Payer: Self-pay | Admitting: Physician Assistant

## 2015-10-16 ENCOUNTER — Ambulatory Visit (INDEPENDENT_AMBULATORY_CARE_PROVIDER_SITE_OTHER): Payer: BLUE CROSS/BLUE SHIELD | Admitting: Physician Assistant

## 2015-10-16 VITALS — BP 120/80 | HR 85 | Temp 98.7°F | Resp 16 | Wt 138.6 lb

## 2015-10-16 DIAGNOSIS — R7303 Prediabetes: Secondary | ICD-10-CM

## 2015-10-16 DIAGNOSIS — R229 Localized swelling, mass and lump, unspecified: Secondary | ICD-10-CM

## 2015-10-16 DIAGNOSIS — E7800 Pure hypercholesterolemia, unspecified: Secondary | ICD-10-CM

## 2015-10-16 DIAGNOSIS — R5382 Chronic fatigue, unspecified: Secondary | ICD-10-CM

## 2015-10-16 DIAGNOSIS — E041 Nontoxic single thyroid nodule: Secondary | ICD-10-CM

## 2015-10-16 DIAGNOSIS — G4710 Hypersomnia, unspecified: Secondary | ICD-10-CM

## 2015-10-16 DIAGNOSIS — R400 Somnolence: Secondary | ICD-10-CM

## 2015-10-16 NOTE — Progress Notes (Signed)
Patient: Ashley Wallace Female    DOB: [birth year]   60 y.o.   MRN: [id number] Visit Date: 10/16/2015  Today's Provider: Margaretann LovelessJennifer M Joey Hudock, PA-C   Chief Complaint  Patient presents with  . Cyst    right, uppper leg   Subjective:    HPI Patient is here today with c/o of lump/knot on right upper leg. No redness, no swelling, and no pain. She repots it feels uncomfortable only when she touches it. She noticed it last week. No injury.  She also is complaining of increased fatigue. She does have a known thyroid nodule that is being watched by Dr. Willeen CassBennett, ENT. Last visit in May the nodule was stable and unchanged. She is curious if her thyroid level is off and that causing her fatigue. She does also report daytime somnolence. She reports her husband has told her that she snores. She has never had a sleep study.    No Known Allergies   Current Outpatient Prescriptions:  .  fluticasone (FLONASE) 50 MCG/ACT nasal spray, Place 2 sprays into both nostrils daily., Disp: 16 g, Rfl: 6 .  ibuprofen ([facility] IBUPROFEN IB) 200 MG tablet, Take by mouth., Disp: , Rfl:  .  loratadine (CLARITIN) 10 MG tablet, Take 10 mg by mouth daily., Disp: , Rfl:  .  NONFORMULARY OR COMPOUNDED ITEM, [facility] compound - Anti inflammatory Cream:  Diclofenac 3%, Baclofen 2%, Cyclobenzaprine 2%, Lidocaine 2% dispense 120 gram apply 1-2 grams to affected area 3-4 times daily, +2refills., Disp: 480 each, Rfl: 11 .  traZODone (DESYREL) 50 MG tablet, Take 50 mg by mouth at bedtime., Disp: , Rfl:  .  zolpidem (AMBIEN CR) 6.25 MG CR tablet, TAKE ONE TABLET BY MOUTH AT BEDTIME AS NEEDED FOR SLEEP, Disp: 30 tablet, Rfl: 5 .  diclofenac (VOLTAREN) 75 MG EC tablet, Take 1 tablet (75 mg total) by mouth 2 (two) times daily. (Patient not taking: Reported on 03/19/2015), Disp: 60 tablet, Rfl: 3 .  diclofenac sodium (VOLTAREN) 1 % GEL, Apply 4 g topically 4 (four) times daily. (Patient not taking: Reported on 03/19/2015),  Disp: 100 g, Rfl: 4 .  meloxicam (MOBIC) 15 MG tablet, Take 1 tablet (15 mg total) by mouth daily. (Patient not taking: Reported on 03/19/2015), Disp: 30 tablet, Rfl: 3 .  Multiple Vitamin (MULTIVITAMIN) tablet, Take 1 tablet by mouth daily., Disp: , Rfl:  .  sulfamethoxazole-trimethoprim (BACTRIM DS,SEPTRA DS) 800-160 MG tablet, Take 1 tablet by mouth 2 (two) times daily. (Patient not taking: Reported on 10/16/2015), Disp: 10 tablet, Rfl: 0  Review of Systems  Constitutional: Positive for fatigue. Negative for fever.  Respiratory: Negative.   Cardiovascular: Negative for chest pain, palpitations and leg swelling.  Gastrointestinal: Negative.   Endocrine: Negative.   Musculoskeletal: Negative.   Skin: Negative.   Neurological: Negative.   Psychiatric/Behavioral: Negative.     Social History  Substance Use Topics  . Smoking status: Never Smoker  . Smokeless tobacco: Never Used  . Alcohol use No   Objective:   BP 120/80 (BP Location: Right Arm, Patient Position: Sitting, Cuff Size: Normal)   Pulse 85   Temp 98.7 F (37.1 C) (Oral)   Resp 16   Wt 138 lb 9.6 oz (62.9 kg)   BMI 24.55 kg/m   Physical Exam  Constitutional: She appears well-developed and well-nourished. No distress.  Neck: Normal range of motion. Neck supple. No tracheal deviation present. Thyroid mass (followed by Dr. Willeen CassBennett) present. No thyromegaly present.  Cardiovascular: Normal rate, regular rhythm and normal heart sounds.  Exam reveals no gallop and no friction rub.   No murmur heard. Pulmonary/Chest: Effort normal and breath sounds normal. No respiratory distress. She has no wheezes. She has no rales.  Lymphadenopathy:    She has no cervical adenopathy.  Skin: Rash noted. Rash is nodular. She is not diaphoretic.     Vitals reviewed.     Assessment & Plan:     1. Thyroid nodule Will check labs as below and f/u pending results. R/O as cause for increasing fatigue. Believe more so it may be a sleep apnea  issue. - Thyroid Panel With TSH  2. Pre-diabetes Will check labs as below and f/u pending results. - HgB A1c  3. Chronic fatigue R/O anemia as cause. Will check labs as below and f/u pending results. - CBC with Differential  4. Daytime somnolence Question sleep apnea as source of fatigue and daytime somnolence. Will check labs as below and f/u pending results to r/o other causes. If all labs are stable will consider sleep study. - CBC with Differential - Comprehensive Metabolic Panel (CMET)  5. Hypercholesterolemia Will check labs as below and f/u pending results. - Lipid Profile  6. Nodule, subcutaneous Cyst vs lipoma vs small injury to anterior thigh causing small contusion vs phlebolith in a small superficial vein. Since it is so small and not causing any issues will do watchful waiting. She is to notify if the nodule changes in size and will consider [place].        [name], PA-C  [facility]

## 2015-10-16 NOTE — Patient Instructions (Signed)
Fatigue  Fatigue is feeling tired all of the time, a lack of energy, or a lack of motivation. Occasional or mild fatigue is often a normal response to activity or life in general. However, long-lasting (chronic) or extreme fatigue may indicate an underlying medical condition.  HOME CARE INSTRUCTIONS   Watch your fatigue for any changes. The following actions may help to lessen any discomfort you are feeling:  · Talk to your health care provider about how much sleep you need each night. Try to get the required amount every night.  · Take medicines only as directed by your health care provider.  · Eat a healthy and nutritious diet. Ask your health care provider if you need help changing your diet.  · Drink enough fluid to keep your urine clear or pale yellow.  · Practice ways of relaxing, such as yoga, meditation, massage therapy, or acupuncture.  · Exercise regularly.    · Change situations that cause you stress. Try to keep your work and personal routine reasonable.  · Do not abuse illegal drugs.  · Limit alcohol intake to no more than 1 drink per day for nonpregnant women and 2 drinks per day for men. One drink equals 12 ounces of beer, 5 ounces of wine, or 1½ ounces of hard liquor.  · Take a multivitamin, if directed by your health care provider.  SEEK MEDICAL CARE IF:   · Your fatigue does not get better.  · You have a fever.    · You have unintentional weight loss or gain.  · You have headaches.    · You have difficulty:      Falling asleep.    Sleeping throughout the night.  · You feel angry, guilty, anxious, or sad.     · You are unable to have a bowel movement (constipation).    · You skin is dry.     · Your legs or another part of your body is swollen.    SEEK IMMEDIATE MEDICAL CARE IF:   · You feel confused.    · Your vision is blurry.  · You feel faint or pass out.    · You have a severe headache.    · You have severe abdominal, pelvic, or back pain.    · You have chest pain, shortness of breath, or an  irregular or fast heartbeat.    · You are unable to urinate or you urinate less than normal.    · You develop abnormal bleeding, such as bleeding from the rectum, vagina, nose, lungs, or nipples.  · You vomit blood.     · You have thoughts about harming yourself or committing suicide.    · You are worried that you might harm someone else.       This information is not intended to replace advice given to you by your health care provider. Make sure you discuss any questions you have with your health care provider.     Document Released: 11/21/2006 Document Revised: 02/14/2014 Document Reviewed: 05/28/2013  Elsevier Interactive Patient Education ©2016 [employer]

## 2015-10-17 LAB — LIPID PANEL
CHOLESTEROL TOTAL: 204 mg/dL — AB (ref 100–199)
Chol/HDL Ratio: 3.2 ratio units (ref 0.0–4.4)
HDL: 64 mg/dL (ref >39)
LDL CALC: 102 mg/dL — AB (ref 0–99)
Triglycerides: 191 mg/dL — ABNORMAL HIGH (ref 0–149)
VLDL Cholesterol Cal: 38 mg/dL (ref 5–40)

## 2015-10-17 LAB — COMPREHENSIVE METABOLIC PANEL
ALT: 11 IU/L (ref 0–32)
AST: 19 IU/L (ref 0–40)
Albumin/Globulin Ratio: 1.9 (ref 1.2–2.2)
Albumin: 4.5 g/dL (ref 3.5–5.5)
Alkaline Phosphatase: 83 IU/L (ref 39–117)
BUN/Creatinine Ratio: 15 (ref 9–23)
BUN: 11 mg/dL (ref 6–24)
Bilirubin Total: 0.4 mg/dL (ref 0.0–1.2)
CALCIUM: 9.8 mg/dL (ref 8.7–10.2)
CO2: 26 mmol/L (ref 18–29)
CREATININE: 0.72 mg/dL (ref 0.57–1.00)
Chloride: 102 mmol/L (ref 96–106)
GFR calc Af Amer: 107 mL/min/{1.73_m2} (ref >59)
GFR, EST NON AFRICAN AMERICAN: 93 mL/min/{1.73_m2} (ref >59)
GLOBULIN, TOTAL: 2.4 g/dL (ref 1.5–4.5)
Glucose: 93 mg/dL (ref 65–99)
Potassium: 4.1 mmol/L (ref 3.5–5.2)
SODIUM: 143 mmol/L (ref 134–144)
Total Protein: 6.9 g/dL (ref 6.0–8.5)

## 2015-10-17 LAB — CBC WITH DIFFERENTIAL/PLATELET
BASOS ABS: 0.1 10*3/uL (ref 0.0–0.2)
Basos: 1 %
EOS (ABSOLUTE): 0.4 10*3/uL (ref 0.0–0.4)
Eos: 5 %
HEMOGLOBIN: 14.1 g/dL (ref 11.1–15.9)
Hematocrit: 42 % (ref 34.0–46.6)
IMMATURE GRANS (ABS): 0 10*3/uL (ref 0.0–0.1)
IMMATURE GRANULOCYTES: 0 %
LYMPHS: 33 %
Lymphocytes Absolute: 2.6 10*3/uL (ref 0.7–3.1)
MCH: 28.5 pg (ref 26.6–33.0)
MCHC: 33.6 g/dL (ref 31.5–35.7)
MCV: 85 fL (ref 79–97)
MONOCYTES: 6 %
Monocytes Absolute: 0.5 10*3/uL (ref 0.1–0.9)
NEUTROS PCT: 55 %
Neutrophils Absolute: 4.2 10*3/uL (ref 1.4–7.0)
Platelets: 298 10*3/uL (ref 150–379)
RBC: 4.95 x10E6/uL (ref 3.77–5.28)
RDW: 13.2 % (ref 12.3–15.4)
WBC: 7.8 10*3/uL (ref 3.4–10.8)

## 2015-10-17 LAB — THYROID PANEL WITH TSH
FREE THYROXINE INDEX: 1.6 (ref 1.2–4.9)
T3 UPTAKE RATIO: 24 % (ref 24–39)
T4 TOTAL: 6.8 ug/dL (ref 4.5–12.0)
TSH: 0.812 u[IU]/mL (ref 0.450–4.500)

## 2015-10-17 LAB — HEMOGLOBIN A1C
ESTIMATED AVERAGE GLUCOSE: 111 mg/dL
Hgb A1c MFr Bld: 5.5 % (ref 4.8–5.6)

## 2015-10-19 ENCOUNTER — Telehealth: Payer: Self-pay

## 2015-10-19 NOTE — Telephone Encounter (Signed)
Pt advised.   Thanks,   -Bette Brienza  

## 2015-10-19 NOTE — Telephone Encounter (Signed)
-----   Message from Margaretann LovelessJennifer M Burnette, PA-C sent at 10/19/2015  8:58 AM EDT ----- All labs are within normal limits and stable.  Thanks! -JB

## 2015-10-19 NOTE — Telephone Encounter (Signed)
LMTCB 10/19/2015  Thanks,   -Maigen Mozingo  

## 2015-10-26 ENCOUNTER — Other Ambulatory Visit: Payer: Self-pay

## 2015-10-26 DIAGNOSIS — G4700 Insomnia, unspecified: Secondary | ICD-10-CM

## 2015-10-26 NOTE — Telephone Encounter (Signed)
Pharmacy requesting refill.

## 2015-10-27 MED ORDER — ZOLPIDEM TARTRATE ER 6.25 MG PO TBCR: 6.2500 mg | EXTENDED_RELEASE_TABLET | Freq: Every evening | ORAL | 5 refills | Status: DC | PRN

## 2015-12-07 ENCOUNTER — Encounter: Payer: BLUE CROSS/BLUE SHIELD | Admitting: Physician Assistant

## 2015-12-14 ENCOUNTER — Ambulatory Visit (INDEPENDENT_AMBULATORY_CARE_PROVIDER_SITE_OTHER): Payer: BLUE CROSS/BLUE SHIELD | Admitting: Physician Assistant

## 2015-12-14 ENCOUNTER — Encounter: Payer: Self-pay | Admitting: Physician Assistant

## 2015-12-14 VITALS — BP 110/80 | HR 96 | Temp 98.6°F | Resp 16 | Ht 62.0 in | Wt 140.0 lb

## 2015-12-14 DIAGNOSIS — Z0000 Encounter for general adult medical examination without abnormal findings: Secondary | ICD-10-CM

## 2015-12-14 DIAGNOSIS — Z1231 Encounter for screening mammogram for malignant neoplasm of breast: Secondary | ICD-10-CM

## 2015-12-14 DIAGNOSIS — Z1239 Encounter for other screening for malignant neoplasm of breast: Secondary | ICD-10-CM

## 2015-12-14 NOTE — Progress Notes (Signed)
Patient: Ashley ArdsMargaret O Angelo, Female    DOB: 1955/08/04, 60 y.o.   MRN: [id number] Visit Date: 12/14/2015  Today's Provider: Margaretann LovelessJennifer M Albie Arizpe, PA-C   Chief Complaint  Patient presents with  . Annual Exam   Subjective:    Annual physical exam Ashley Wallace is a 60 y.o. female who presents today for health maintenance and complete physical. She feels well. She reports exercising daily. She reports she is sleeping fairly well.  CPE-12/04/14 Pap-12/04/14-negative Mammogram-01/13/2015-BI-RADS 1 Colonoscopy-07/13/11-Diverticulosis,Polyps-Recheck in 10 years. Dr. Mechele CollinElliott -----------------------------------------------------------------  Review of Systems  Constitutional: Negative.   HENT: Negative.   Eyes: Negative.   Respiratory: Negative.   Cardiovascular: Negative.   Gastrointestinal: Negative.   Endocrine: Negative.   Genitourinary: Negative.   Musculoskeletal: Negative.   Skin: Negative.   Allergic/Immunologic: Negative.   Neurological: Negative.   Psychiatric/Behavioral: Negative.     Social History      She  reports that she has never smoked. She has never used smokeless tobacco. She reports that she does not drink alcohol or use drugs.       Social History   Social History  . Marital status: Married    Spouse name: N/A  . Number of children: N/A  . Years of education: N/A   Social History Main Topics  . Smoking status: Never Smoker  . Smokeless tobacco: Never Used  . Alcohol use No  . Drug use: No  . Sexual activity: Yes   Other Topics Concern  . None   Social History Narrative  . None    Past Medical History:  Diagnosis Date  . Acid reflux 12/02/2014  . Arthritis   . Headache   . Left thyroid nodule   . Muscle spasms of neck 12/02/2014     Patient Active Problem List   Diagnosis Date Noted  . Dermatitis, eczematoid 12/02/2014  . GERD (gastroesophageal reflux disease) 12/02/2014  . 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 Pre-diabetes  12/02/2014  . Pain in shoulder 12/02/2014  . Thyroid nodule 12/02/2014  . Tinea corporis 12/02/2014  . 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 Allergic rhinitis 07/14/2009  . Disorder of eustachian tube 03/26/2009  . AD (atopic dermatitis) 03/26/2009  . Chronic infection of sinus 12/27/2007  . Insomnia 12/27/2007  . Disturbance of skin sensation 08/23/2006  . Multilevel degenerative disc disease 02/07/1998    Past Surgical History:  Procedure Laterality Date  . CHOLECYSTECTOMY  1999  . CORONARY ARTERY BYPASS GRAFT  1999  . tosillectomy    . TUBAL LIGATION      Family History        Family Status  Relation Status  . Mother Deceased at age 58   cause of death was Failure To Thrive  . Father Deceased   Cause of Death was cancer-had a brain tumor, lung cancer and Emphysema  . Maternal Aunt   . Sister Alive  . Brother Alive  . Daughter Alive  . Son Alive  . Sister Alive  . Brother Deceased at age 58   lymph  . Brother Alive  . Brother Deceased   work related accident  . Brother Deceased   shot  . Brother Alive        Her family history includes Breast cancer in her maternal aunt; Cancer in her brother and brother; Diabetes in her mother, sister, and sister; Hypertension in her brother, brother, mother, sister, and sister; Leukemia in her brother.     No Known Allergies  Current Outpatient Prescriptions:  .  fluticasone (FLONASE) 50 MCG/ACT nasal spray, Place 2 sprays into both nostrils daily., Disp: 16 g, Rfl: 6 .  ibuprofen ([facility] IBUPROFEN IB) 200 MG tablet, Take by mouth., Disp: , Rfl:  .  loratadine (CLARITIN) 10 MG tablet, Take 10 mg by mouth daily., Disp: , Rfl:  .  NONFORMULARY OR COMPOUNDED ITEM, [facility] compound - Anti inflammatory Cream:  Diclofenac 3%, Baclofen 2%, Cyclobenzaprine 2%, Lidocaine 2% dispense 120 gram apply 1-2 grams to affected area 3-4 times daily, +2refills., Disp: 480 each, Rfl: 11 .  traZODone (DESYREL) 50 MG tablet, Take 50  mg by mouth at bedtime., Disp: , Rfl:  .  zolpidem (AMBIEN CR) 6.25 MG CR tablet, Take 1 tablet (6.25 mg total) by mouth at bedtime as needed. for sleep, Disp: 30 tablet, Rfl: 5   Patient Care Team: Gwendalyn Mcgonagle M BMargaretann Lovelessurnette, PA-C as PCP - General (Family Medicine)      Objective:   Vitals: BP 110/80 (BP Location: Right Arm, Patient Position: Sitting, Cuff Size: Large)   Pulse 96   Temp 98.6 F (37 C) (Oral)   Resp 16   Ht 5\' 2"  (1.575 m)   Wt 140 lb (63.5 kg)   BMI 25.61 kg/m    Physical Exam  Constitutional: She is oriented to person, place, and time. She appears well-developed and well-nourished. No distress.  HENT:  Head: Normocephalic and atraumatic.  Right Ear: Tympanic membrane, external ear and ear canal normal.  Left Ear: Tympanic membrane, external ear and ear canal normal.  Nose: Nose normal.  Mouth/Throat: Uvula is midline, oropharynx is clear and moist and mucous membranes are normal. No oropharyngeal exudate.  Eyes: Conjunctivae and EOM are normal. Pupils are equal, round, and reactive to light. Right eye exhibits no discharge. Left eye exhibits no discharge. No scleral icterus.  Neck: Normal range of motion. Neck supple. No JVD present. Carotid bruit is not present. No tracheal deviation present. No thyromegaly present.  Cardiovascular: Normal rate, regular rhythm, normal heart sounds and intact distal pulses.  Exam reveals no gallop and no friction rub.   No murmur heard. Pulmonary/Chest: Effort normal and breath sounds normal. No respiratory distress. She has no wheezes. She has no rales. She exhibits no tenderness. Right breast exhibits no inverted nipple, no mass, no nipple discharge, no skin change and no tenderness. Left breast exhibits no inverted nipple, no mass, no nipple discharge, no skin change and no tenderness. Breasts are symmetrical.  Abdominal: Soft. Bowel sounds are normal. She exhibits no distension and no mass. There is no tenderness. There is no rebound  and no guarding.  Musculoskeletal: Normal range of motion. She exhibits no edema or tenderness.  Lymphadenopathy:    She has no cervical adenopathy.  Neurological: She is alert and oriented to person, place, and time.  Skin: Skin is warm and dry. No rash noted. She is not diaphoretic.  Psychiatric: She has a normal mood and affect. Her behavior is normal. Judgment and thought content normal.  Vitals reviewed.    Depression Screen PHQ 2/9 Scores 12/14/2015  PHQ - 2 Score 0      Assessment & Plan:     Routine Health Maintenance and Physical Exam  Exercise Activities and Dietary recommendations Goals    None      Immunization History  Administered Date(s) Administered  . Tdap 09/05/2012    Health Maintenance  Topic Date Due  . Hepatitis C Screening  12/22/1955  . HIV Screening  07/28/1972  . INFLUENZA VACCINE  05/07/2016 (Originally 09/08/2015)  . MAMMOGRAM  01/12/2017  . PAP SMEAR  12/03/2017  . COLONOSCOPY  07/12/2021  . TETANUS/TDAP  09/06/2022     Discussed health benefits of physical activity, and encouraged her to engage in regular exercise appropriate for her age and condition.   1. Annual physical exam Normal physical exam today. Labs checked in 10/2015 and were normal. She does not need to return for one year at her next annual physical exam. She is to call the office in the meantime if she has any acute issue, questions or concerns.  2. Breast cancer screening Breast exam today was normal. There is no family history of breast cancer. She does perform regular self breast exams. Mammogram was ordered as below. Information for [facility] clinic was given to patient so she may schedule her mammogram at her convenience. - MM Digital Screening; Future  --------------------------------------------------------------------    [name], PA-C  [facility]

## 2015-12-14 NOTE — Patient Instructions (Signed)

## 2016-01-11 ENCOUNTER — Ambulatory Visit (INDEPENDENT_AMBULATORY_CARE_PROVIDER_SITE_OTHER): Payer: BLUE CROSS/BLUE SHIELD

## 2016-01-11 ENCOUNTER — Ambulatory Visit (INDEPENDENT_AMBULATORY_CARE_PROVIDER_SITE_OTHER): Payer: BLUE CROSS/BLUE SHIELD | Admitting: Podiatry

## 2016-01-11 ENCOUNTER — Encounter: Payer: Self-pay | Admitting: Podiatry

## 2016-01-11 DIAGNOSIS — G5791 Unspecified mononeuropathy of right lower limb: Secondary | ICD-10-CM

## 2016-01-11 DIAGNOSIS — M79671 Pain in right foot: Secondary | ICD-10-CM | POA: Diagnosis not present

## 2016-01-11 NOTE — Progress Notes (Signed)
She presents today with a chief complaint of pain to the distal lateral aspect of the right foot for the past 3 weeks or so. She states that sometimes it feels like it is moving under there is a step down on it and she refers to the head of the fifth metatarsal right foot. She's tried using ice and a pain compound with anti-inflammatory that we have provided for her previously and she states that it feels very good and continues to work well.  Objective: Vital signs are stable she's alert and oriented 3. She has pain on palpation with what appears to be a inflamed nerve on the plantar aspect of the fifth metatarsal head right foot. Pulses remain palpable neurologic sensory is intact. Deep tendon reflexes are intact. Muscle strength is normal. Orthopedic evaluation shows all joints distally to the ankle for range of motion and crepitation. Case evaluation demonstrates no open lesions or wounds.  Assessment: Neuritis fifth metatarsal phalangeal joint plantar lateral aspect right foot.  Plan: Injected the area today with Kenalog and local anesthetic.

## 2016-02-18 ENCOUNTER — Encounter: Payer: Self-pay | Admitting: Physician Assistant

## 2016-02-18 ENCOUNTER — Ambulatory Visit (INDEPENDENT_AMBULATORY_CARE_PROVIDER_SITE_OTHER): Payer: BLUE CROSS/BLUE SHIELD | Admitting: Physician Assistant

## 2016-02-18 VITALS — BP 140/88 | HR 100 | Temp 98.1°F | Resp 16 | Wt 143.4 lb

## 2016-02-18 DIAGNOSIS — R42 Dizziness and giddiness: Secondary | ICD-10-CM

## 2016-02-18 DIAGNOSIS — J0140 Acute pansinusitis, unspecified: Secondary | ICD-10-CM

## 2016-02-18 MED ORDER — AMOXICILLIN-POT CLAVULANATE 875-125 MG PO TABS: 1.0000 | ORAL_TABLET | Freq: Two times a day (BID) | ORAL | 0 refills | Status: DC

## 2016-02-18 MED ORDER — MECLIZINE HCL 25 MG PO TABS: 12.5000 mg | ORAL_TABLET | Freq: Three times a day (TID) | ORAL | 0 refills | Status: DC | PRN

## 2016-02-18 NOTE — Patient Instructions (Signed)

## 2016-02-18 NOTE — Progress Notes (Signed)
Patient: Ashley Wallace Female    DOB: November 18, 1955   61 y.o.   MRN: [id number] Visit Date: 02/18/2016  Today's Provider: Margaretann LovelessJennifer M Avigdor Dollar, PA-C   Chief Complaint  Patient presents with  . Sinusitis   Subjective:    Sinusitis  This is a new problem. The current episode started in the past 7 days (Started on Monday). The problem has been gradually worsening since onset. There has been no fever. Associated symptoms include chills, congestion, coughing, ear pain (fullness), headaches, sinus pressure, sneezing and a sore throat. Pertinent negatives include no shortness of breath.  She does normally get dizziness with her sinus infections. She reports the dizziness is the same as previous times.     No Known Allergies   Current Outpatient Prescriptions:  .  fluticasone (FLONASE) 50 MCG/ACT nasal spray, Place 2 sprays into both nostrils daily., Disp: 16 g, Rfl: 6 .  ibuprofen (ADVIL,MOTRIN) 200 MG tablet, Take by mouth., Disp: , Rfl:  .  loratadine (CLARITIN) 10 MG tablet, Take 10 mg by mouth daily., Disp: , Rfl:  .  NONFORMULARY OR COMPOUNDED ITEM, [facility] compound - Anti inflammatory Cream:  Diclofenac 3%, Baclofen 2%, Cyclobenzaprine 2%, Lidocaine 2% dispense 120 gram apply 1-2 grams to affected area 3-4 times daily, +2refills., Disp: 480 each, Rfl: 11 .  zolpidem (AMBIEN CR) 6.25 MG CR tablet, Take 1 tablet (6.25 mg total) by mouth at bedtime as needed. for sleep (Patient not taking: Reported on 02/18/2016), Disp: 30 tablet, Rfl: 5  Review of Systems  Constitutional: Positive for chills. Negative for fever.  HENT: Positive for congestion, ear pain (fullness), postnasal drip, rhinorrhea, sinus pain, sinus pressure, sneezing and sore throat. Negative for ear discharge, hearing loss and tinnitus.   Respiratory: Positive for cough. Negative for chest tightness, shortness of breath and wheezing.   Cardiovascular: Negative for chest pain, palpitations and leg  swelling.  Gastrointestinal: Negative for abdominal pain.  Neurological: Positive for dizziness and headaches. Negative for light-headedness.    Social History  Substance Use Topics  . Smoking status: Never Smoker  . Smokeless tobacco: Never Used  . Alcohol use No   Objective:   BP 140/88 (BP Location: Right Arm, Patient Position: Sitting, Cuff Size: Normal)   Pulse 100   Temp 98.1 F (36.7 C) (Oral)   Resp 16   Wt 143 lb 6.4 oz (65 kg)   SpO2 98%   BMI 26.23 kg/m   Physical Exam  Constitutional: She appears well-developed and well-nourished. No distress.  HENT:  Head: Normocephalic and atraumatic.  Right Ear: Hearing, external ear and ear canal normal. Tympanic membrane is bulging. Tympanic membrane is not erythematous. A middle ear effusion (clear) is present.  Left Ear: Hearing, external ear and ear canal normal. Tympanic membrane is bulging. Tympanic membrane is not erythematous. A middle ear effusion (clear) is present.  Nose: Mucosal edema present. No rhinorrhea. Right sinus exhibits maxillary sinus tenderness and frontal sinus tenderness. Left sinus exhibits maxillary sinus tenderness and frontal sinus tenderness.  Mouth/Throat: Uvula is midline, oropharynx is clear and moist and mucous membranes are normal. No oropharyngeal exudate, posterior oropharyngeal edema or posterior oropharyngeal erythema.  Neck: Normal range of motion. Neck supple. No tracheal deviation present. No thyromegaly present.  Cardiovascular: Normal rate, regular rhythm and normal heart sounds.  Exam reveals no gallop and no friction rub.   No murmur heard. Pulmonary/Chest: Effort normal and breath sounds normal. No stridor. No respiratory distress. She has  no wheezes. She has no rales.  Lymphadenopathy:    She has no cervical adenopathy.  Skin: She is not diaphoretic.  Vitals reviewed.      Assessment & Plan:     1. Acute pansinusitis, recurrence not specified Worsening symptoms that have not  responded to OTC medications. Will give augmentin as below. Continue allergy medications. Stay well hydrated and get plenty of rest. Call if no symptom improvement or if symptoms worsen. - amoxicillin-clavulanate (AUGMENTIN) 875-125 MG tablet; Take 1 tablet by mouth 2 (two) times daily.  Dispense: 20 tablet; Refill: 0  2. Vertigo Will add meclizine for as needed use for the dizziness as below.  - meclizine (ANTIVERT) 25 MG tablet; Take 0.5-1 tablets (12.5-25 mg total) by mouth 3 (three) times daily as needed for dizziness.  Dispense: 30 tablet; Refill: 0       [name], PA-C  [facility]

## 2016-02-22 ENCOUNTER — Ambulatory Visit: Payer: BLUE CROSS/BLUE SHIELD | Admitting: Podiatry

## 2016-02-29 ENCOUNTER — Telehealth: Payer: Self-pay | Admitting: Physician Assistant

## 2016-02-29 DIAGNOSIS — J0140 Acute pansinusitis, unspecified: Secondary | ICD-10-CM

## 2016-02-29 MED ORDER — DOXYCYCLINE HYCLATE 100 MG PO TABS: 100.0000 mg | ORAL_TABLET | Freq: Two times a day (BID) | ORAL | 0 refills | Status: DC

## 2016-02-29 NOTE — Telephone Encounter (Signed)
Pt was in for sinus infection and has been taking antibiotic.  She just finished it Saturday and still having a lot of symptoms that she had when she came in  Please advise [phone]  Thanks, Barth Kirksteri

## 2016-02-29 NOTE — Telephone Encounter (Signed)
Pt advised. Emily Drozdowski, CMA  

## 2016-02-29 NOTE — Telephone Encounter (Signed)
Sent in Doxycycline to [facility]

## 2016-02-29 NOTE — Telephone Encounter (Signed)
Pt still c/o dry cough, nasal congestion. Nasal mucus is yellow again, and it was clear while taking abx. Pt also C/O PND, some H/A and bilateral otalgia (which has improved). [facility] pharmacy. Please advise. Allene DillonEmily Drozdowski, CMA

## 2016-06-20 ENCOUNTER — Ambulatory Visit (INDEPENDENT_AMBULATORY_CARE_PROVIDER_SITE_OTHER): Payer: BLUE CROSS/BLUE SHIELD | Admitting: Physician Assistant

## 2016-06-20 ENCOUNTER — Encounter: Payer: Self-pay | Admitting: Physician Assistant

## 2016-06-20 VITALS — BP 120/80 | HR 77 | Temp 97.9°F | Resp 16 | Wt 146.0 lb

## 2016-06-20 DIAGNOSIS — L237 Allergic contact dermatitis due to plants, except food: Secondary | ICD-10-CM

## 2016-06-20 MED ORDER — PREDNISONE 10 MG (21) PO TBPK: ORAL_TABLET | ORAL | 0 refills | Status: DC

## 2016-06-20 NOTE — Patient Instructions (Signed)
Poison Oak Dermatitis Poison oak dermatitis is inflammation of the skin that is caused by contact with the allergens on the leaves of the poison oak (toxicodendron) plant. The skin reaction often includes redness, swelling, blisters, and extreme itching. What are the causes? This condition is caused by a specific chemical (urushiol) that is found in the sap of the poison oak plant. This chemical is sticky and it can be easily spread to people, animals, and objects. You can get poison oak dermatitis by:  Having direct contact with a poison oak plant.  Touching animals, other people, or objects that have come in contact with poison oak and have the chemical on them. What increases the risk? This condition is more likely to develop in people who:  Are outdoors often.  Go outdoors without wearing protective clothing, such as closed shoes, long pants, and a long-sleeved shirt. What are the signs or symptoms? Symptoms of this condition include:  Redness of the skin.  A rash that may develop blisters.  Extreme itching.  Swelling. This may occur if the reaction is more severe. Symptoms usually last for 1-2 weeks. However, the first time you develop this condition, symptoms may last 3-4 weeks. How is this diagnosed? This condition may be diagnosed based on your symptoms and a physical exam. Your health care provider may also ask you about any recent outdoor activity. How is this treated? Treatment for this condition will vary depending on how severe it is. Treatment may include:  Hydrocortisone creams or calamine lotions to relieve itching.  Oatmeal baths to soothe the skin.  Over-the-counter antihistamine tablets.  Oral steroid medicine for more severe outbreaks. Follow these instructions at home:  Take or apply over-the-counter and prescription medicines only as told by your health care provider.  Wash exposed skin as soon as possible with soap and cold water.  Use hydrocortisone  creams or calamine lotion as needed to soothe the skin and relieve itching.  Take oatmeal baths as needed. Use colloidal oatmeal. You can get this at your local pharmacy or grocery store. Follow the instructions on the packaging.  Do not scratch or rub your skin.  While you have the rash, wash clothes right after you wear them. How is this prevented?  Learn to identify the poison oak plant and avoid contact with the plant. This plant can be recognized by the number of leaves. Generally, poison oak has three leaves with flowering branches on a single stem. The leaves are often a bit fuzzy and have a toothlike edge.  If you have been exposed to poison oak, thoroughly wash with soap and water right away. You have about 30 minutes to remove the plant resin before it will cause the rash. Be sure to wash under your fingernails because any plant resin there will continue to spread the rash.  When hiking or camping, wear clothes that will help you avoid exposure on the skin. This includes long pants, a long-sleeved shirt, tall socks, and hiking boots. You can also apply preventive lotion to your skin to help limit exposure.  If you suspect that your clothes or outdoor gear came in contact with poison oak, rinse them off outside with a garden hose before bringing them inside your house. Contact a health care provider if:  You have open sores in the rash area.  You have more redness, swelling, or pain in the affected area.  You have redness that spreads beyond the rash area.  You have fluid, blood, or pus   coming from the affected area.  You have a fever.  You have a rash over a large area of your body.  You have a rash on your eyes, mouth, or genitals.  Your rash does not improve after a few days. Get help right away if:  Your face swells or your eyes swell shut.  You have trouble breathing.  You have trouble swallowing. This information is not intended to replace advice given to you by  your health care provider. Make sure you discuss any questions you have with your health care provider. Document Released: 07/31/2002 Document Revised: 07/02/2015 Document Reviewed: 07/02/2014 Elsevier Interactive Patient Education  2017 [employer]  

## 2016-06-20 NOTE — Progress Notes (Signed)
Patient: Ashley Wallace Female    DOB: Nov 23, 1955   61 y.o.   MRN: [id number] Visit Date: 06/20/2016  Today's Provider: Margaretann LovelessJennifer M Bethann Qualley, PA-C   Chief Complaint  Patient presents with  . Rash   Subjective:    Rash  This is a new problem. The current episode started in the past 7 days (Friday). The problem has been gradually worsening since onset. The affected locations include the left hand and right hand. The rash is characterized by redness, pain and itchiness. She was exposed to plant contact. Pertinent negatives include no congestion, cough, fatigue, fever, joint pain, rhinorrhea, shortness of breath or sore throat. Treatments tried: She tried OTC wash for poison oak  The treatment provided no relief (took Benadryl this AM ).      Not on File   Current Outpatient Prescriptions:  .  fluticasone (FLONASE) 50 MCG/ACT nasal spray, Place 2 sprays into both nostrils daily., Disp: 16 g, Rfl: 6 .  ibuprofen (ADVIL,MOTRIN) 200 MG tablet, Take by mouth., Disp: , Rfl:  .  loratadine (CLARITIN) 10 MG tablet, Take 10 mg by mouth daily., Disp: , Rfl:  .  NONFORMULARY OR COMPOUNDED ITEM, [facility] compound - Anti inflammatory Cream:  Diclofenac 3%, Baclofen 2%, Cyclobenzaprine 2%, Lidocaine 2% dispense 120 gram apply 1-2 grams to affected area 3-4 times daily, +2refills., Disp: 480 each, Rfl: 11 .  doxycycline (VIBRA-TABS) 100 MG tablet, Take 1 tablet (100 mg total) by mouth 2 (two) times daily. (Patient not taking: Reported on 06/20/2016), Disp: 20 tablet, Rfl: 0 .  meclizine (ANTIVERT) 25 MG tablet, Take 0.5-1 tablets (12.5-25 mg total) by mouth 3 (three) times daily as needed for dizziness. (Patient not taking: Reported on 06/20/2016), Disp: 30 tablet, Rfl: 0 .  zolpidem (AMBIEN CR) 6.25 MG CR tablet, Take 1 tablet (6.25 mg total) by mouth at bedtime as needed. for sleep (Patient not taking: Reported on 02/18/2016), Disp: 30 tablet, Rfl: 5  Review of Systems    Constitutional: Negative for fatigue and fever.  HENT: Negative for congestion, rhinorrhea and sore throat.   Respiratory: Negative for cough and shortness of breath.   Cardiovascular: Negative for chest pain, palpitations and leg swelling.  Musculoskeletal: Negative for joint pain.  Skin: Positive for rash.    Social History  Substance Use Topics  . Smoking status: Never Smoker  . Smokeless tobacco: Never Used  . Alcohol use No   Objective:   BP 120/80 (BP Location: Right Arm, Patient Position: Sitting, Cuff Size: Normal)   Pulse 77   Temp 97.9 F (36.6 C) (Oral)   Resp 16   Wt 146 lb (66.2 kg)   BMI 26.70 kg/m    Physical Exam  Constitutional: She appears well-developed and well-nourished. No distress.  Cardiovascular: Normal rate, regular rhythm and normal heart sounds.  Exam reveals no gallop and no friction rub.   No murmur heard. Pulmonary/Chest: Effort normal and breath sounds normal. No respiratory distress. She has no wheezes. She has no rales.  Skin: Rash noted. Rash is vesicular (diffuse on both hands and spreading up upper extremities bilaterally in lenear chains). She is not diaphoretic.  Vitals reviewed.      Assessment & Plan:     1. Poison oak dermatitis Will give prednisone as below to see if this will help dry up rash. May continue benadryl as needed for itch, hydrocortisone topically as needed. She is to call if no improvement. - predniSONE (STERAPRED UNI-PAK  21 TAB) 10 MG (21) TBPK tablet; Take as directed on package instructions  Dispense: 21 tablet; Refill: 0       [name], PA-C  [facility]

## 2016-09-24 IMAGING — CR CERVICAL SPINE - COMPLETE 4+ VIEW
1 series · 6 of 6 positions shown · non-contrast
Comparison: None.

CLINICAL DATA: One month history of bilateral neck pain without
known injury

EXAM:
CERVICAL SPINE  4+ VIEWS

[Series 1: kdxr c-spine complete · 0.14mm/px · 6 of 6 slices shown]
[im 1/6]
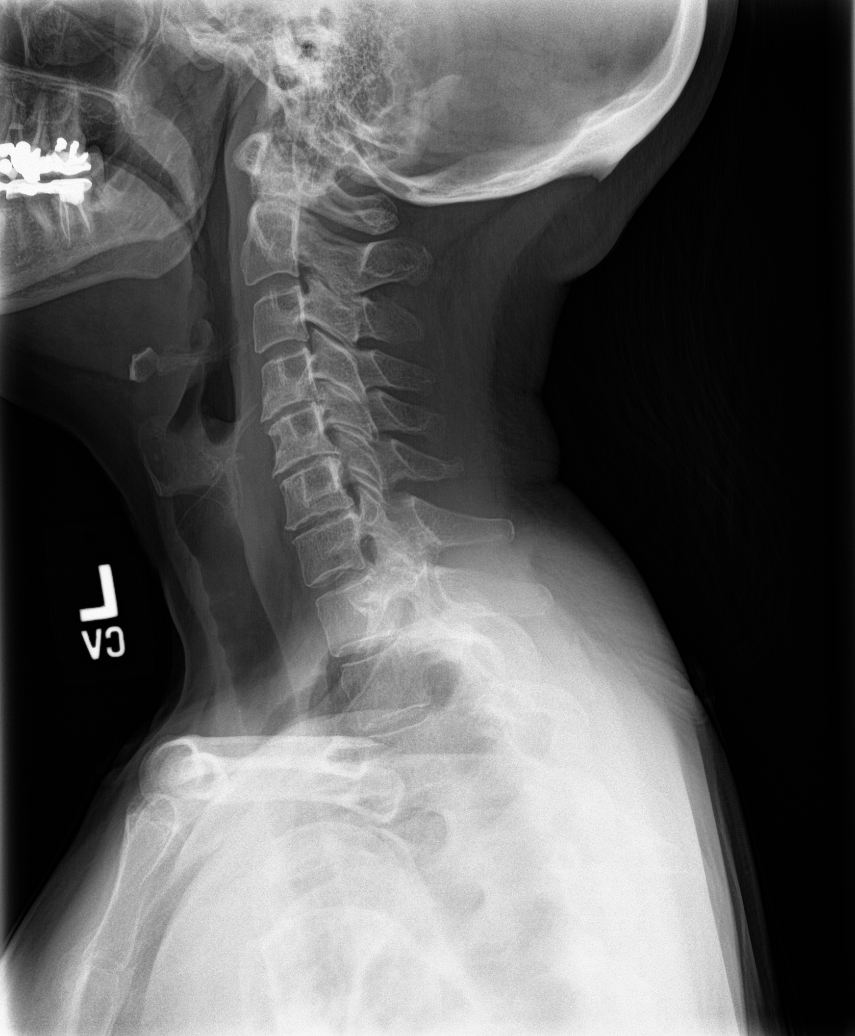
[im 2/6]
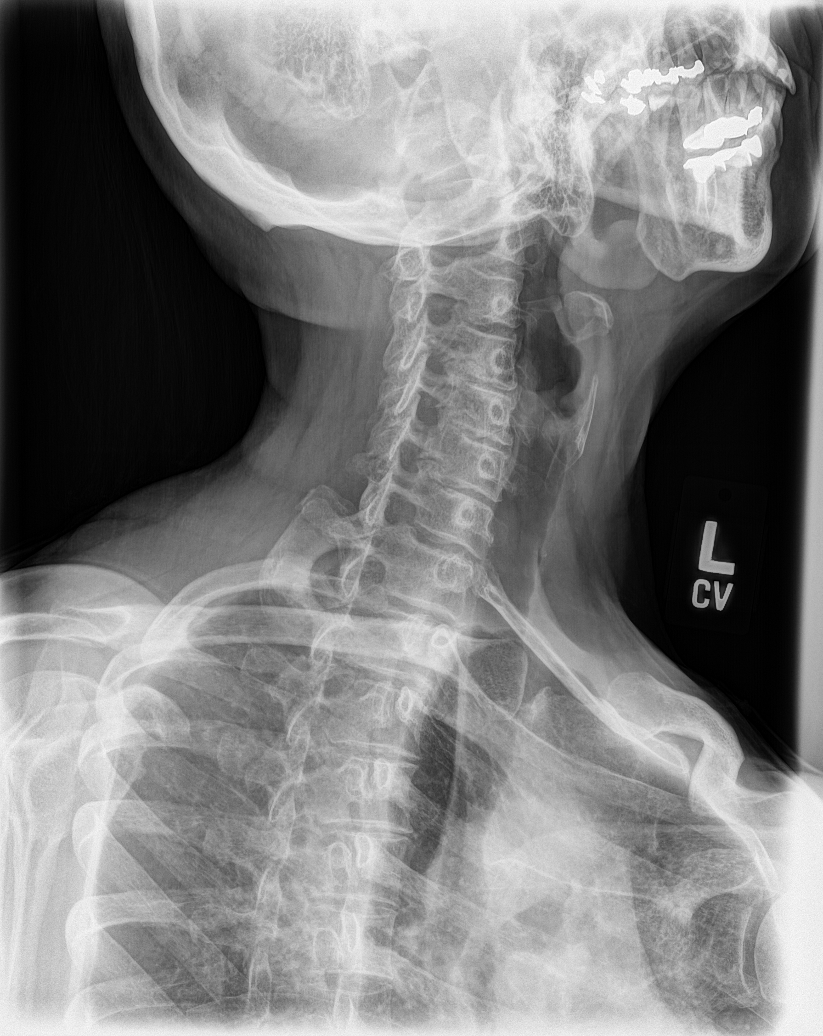
[im 3/6]
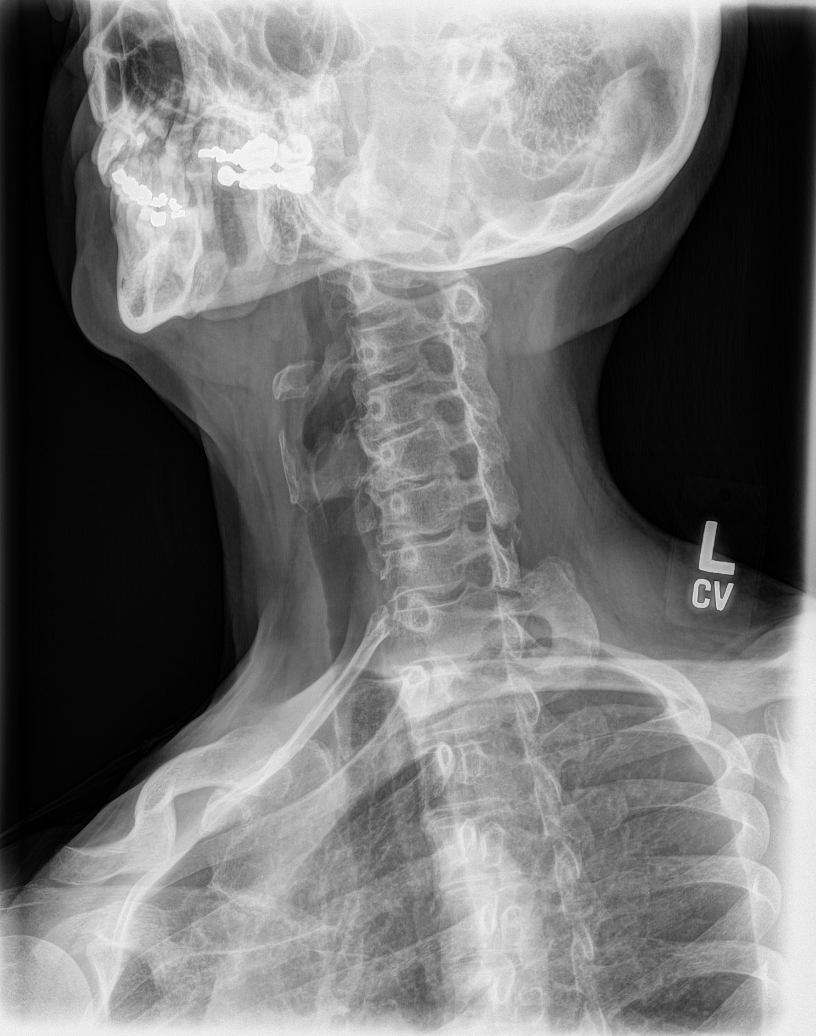
[im 4/6]
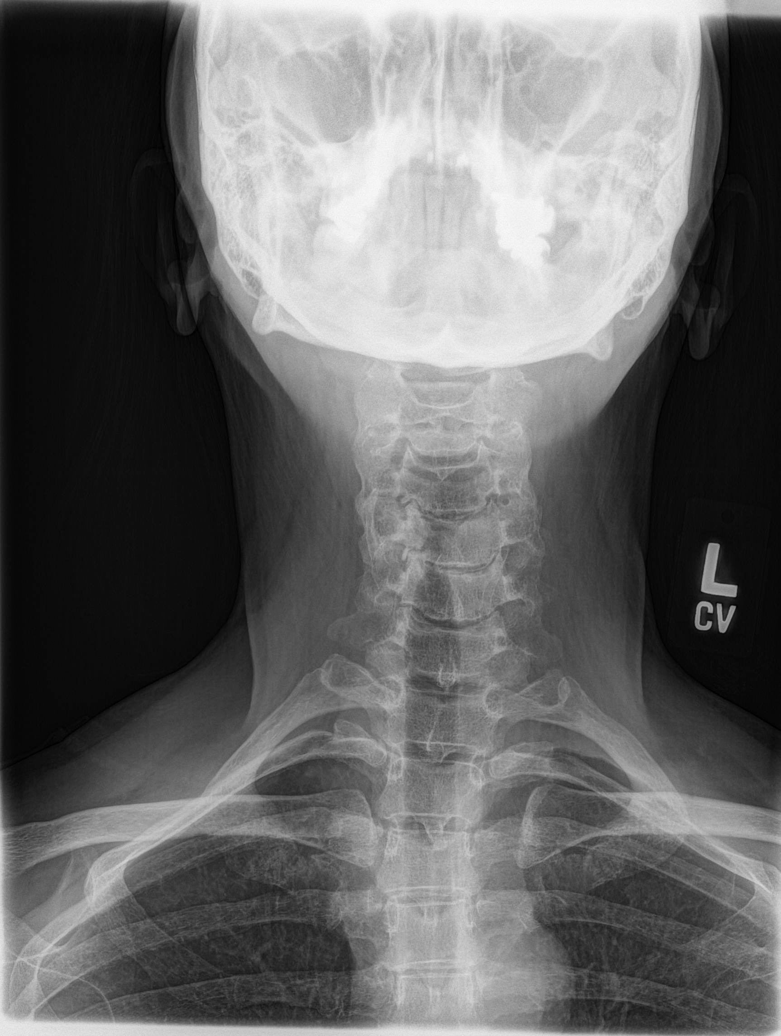
[im 5/6]
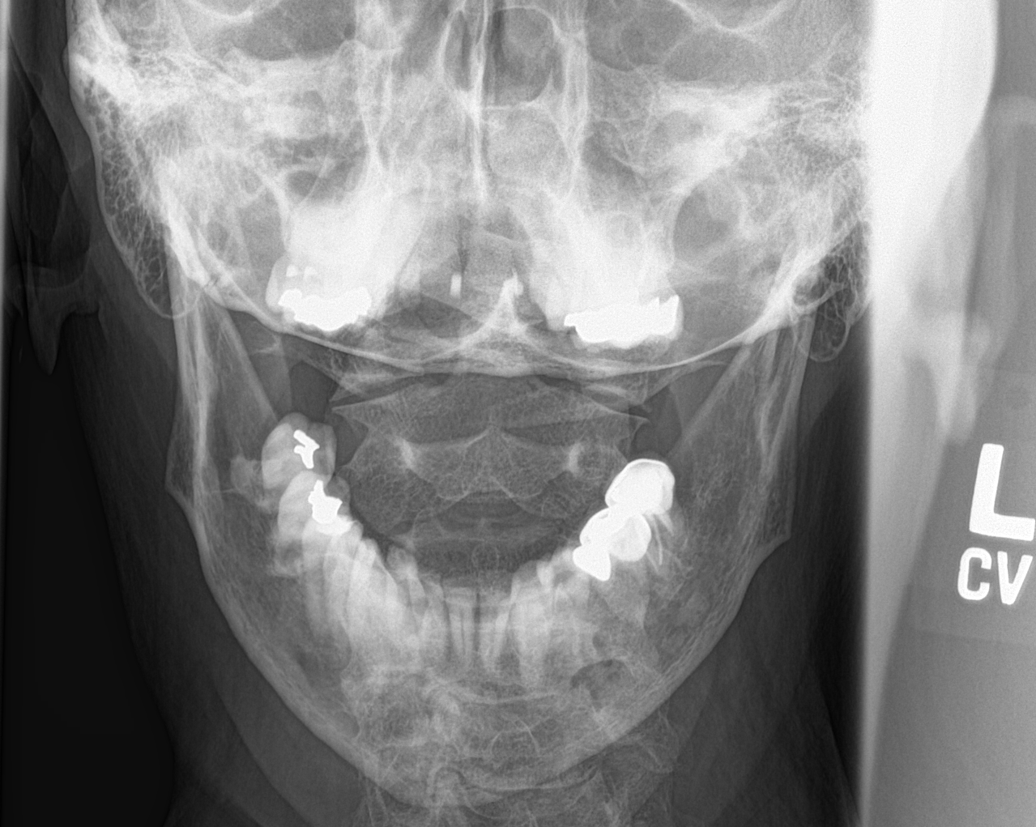
[im 6/6]
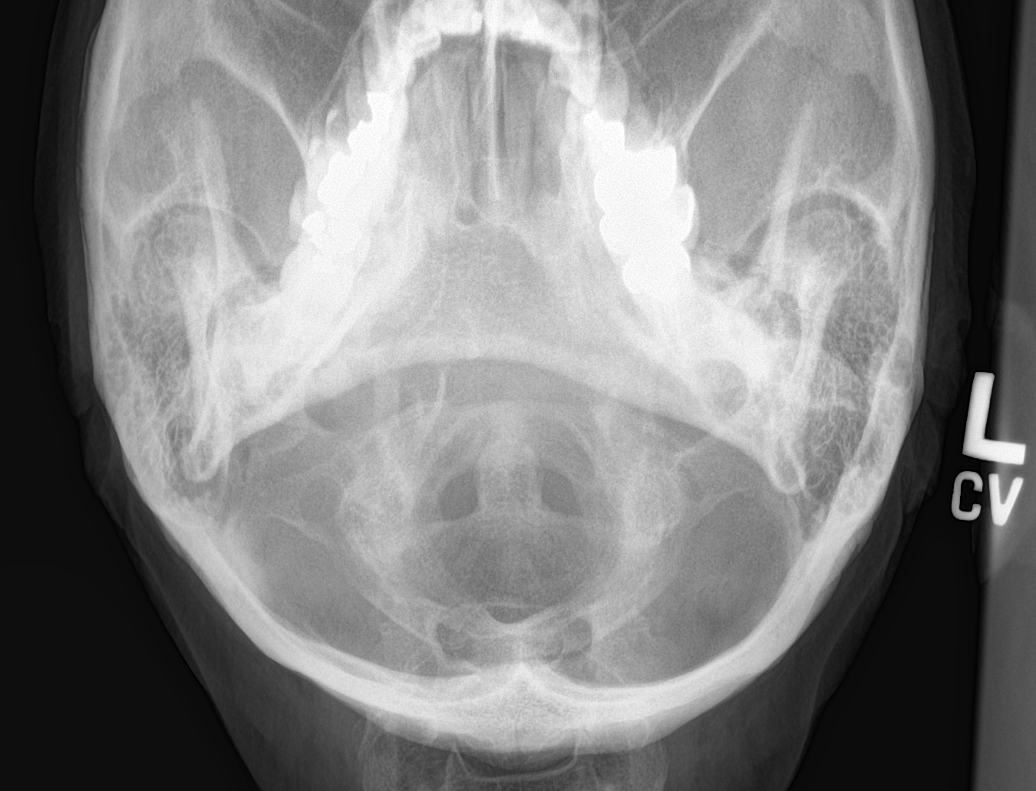

[6 of 6 positions shown; findings below may reference images not displayed]

FINDINGS: The cervical vertebral bodies are preserved in height. There is disc
space narrowing at C4-5, C5-6, and at C6-7. There small anterior
endplate osteophytes at these levels. There are posterior endplate
osteophytes at C5-6. There is no perched facet. The oblique views
reveal mild bony encroachment upon the neural foramina on the right
at multiple levels. The prevertebral soft tissue spaces are normal.
The odontoid is intact.
IMPRESSION: There is multilevel degenerative disc disease and facet joint
change. There is no compression fracture. There is mild bony
encroachment upon the neural foramina on the right.

## 2016-12-16 ENCOUNTER — Encounter: Payer: Self-pay | Admitting: Physician Assistant

## 2016-12-16 ENCOUNTER — Ambulatory Visit (INDEPENDENT_AMBULATORY_CARE_PROVIDER_SITE_OTHER): Payer: BLUE CROSS/BLUE SHIELD | Admitting: Physician Assistant

## 2016-12-16 VITALS — BP 120/80 | HR 94 | Temp 97.4°F | Resp 16 | Ht 62.0 in | Wt 143.0 lb

## 2016-12-16 DIAGNOSIS — Z1231 Encounter for screening mammogram for malignant neoplasm of breast: Secondary | ICD-10-CM | POA: Diagnosis not present

## 2016-12-16 DIAGNOSIS — R7303 Prediabetes: Secondary | ICD-10-CM

## 2016-12-16 DIAGNOSIS — E041 Nontoxic single thyroid nodule: Secondary | ICD-10-CM | POA: Diagnosis not present

## 2016-12-16 DIAGNOSIS — Z1159 Encounter for screening for other viral diseases: Secondary | ICD-10-CM

## 2016-12-16 DIAGNOSIS — Z1239 Encounter for other screening for malignant neoplasm of breast: Secondary | ICD-10-CM

## 2016-12-16 DIAGNOSIS — E7800 Pure hypercholesterolemia, unspecified: Secondary | ICD-10-CM | POA: Diagnosis not present

## 2016-12-16 DIAGNOSIS — Z0000 Encounter for general adult medical examination without abnormal findings: Secondary | ICD-10-CM

## 2016-12-16 NOTE — Patient Instructions (Signed)
Health Maintenance for Postmenopausal Women Menopause is a normal process in which your reproductive ability comes to an end. This process happens gradually over a span of months to years, usually between the ages of 22 and 9. Menopause is complete when you have missed 12 consecutive menstrual periods. It is important to talk with your health care provider about some of the most common conditions that affect postmenopausal women, such as heart disease, cancer, and bone loss (osteoporosis). Adopting a healthy lifestyle and getting preventive care can help to promote your health and wellness. Those actions can also lower your chances of developing some of these common conditions. What should I know about menopause? During menopause, you may experience a number of symptoms, such as:  Moderate-to-severe hot flashes.  Night sweats.  Decrease in sex drive.  Mood swings.  Headaches.  Tiredness.  Irritability.  Memory problems.  Insomnia.  Choosing to treat or not to treat menopausal changes is an individual decision that you make with your health care provider. What should I know about hormone replacement therapy and supplements? Hormone therapy products are effective for treating symptoms that are associated with menopause, such as hot flashes and night sweats. Hormone replacement carries certain risks, especially as you become older. If you are thinking about using estrogen or estrogen with progestin treatments, discuss the benefits and risks with your health care provider. What should I know about heart disease and stroke? Heart disease, heart attack, and stroke become more likely as you age. This may be due, in part, to the hormonal changes that your body experiences during menopause. These can affect how your body processes dietary fats, triglycerides, and cholesterol. Heart attack and stroke are both medical emergencies. There are many things that you can do to help prevent heart disease  and stroke:  Have your blood pressure checked at least every 1-2 years. High blood pressure causes heart disease and increases the risk of stroke.  If you are 53-22 years old, ask your health care provider if you should take aspirin to prevent a heart attack or a stroke.  Do not use any tobacco products, including cigarettes, chewing tobacco, or electronic cigarettes. If you need help quitting, ask your health care provider.  It is important to eat a healthy diet and maintain a healthy weight. ? Be sure to include plenty of vegetables, fruits, low-fat dairy products, and lean protein. ? Avoid eating foods that are high in solid fats, added sugars, or salt (sodium).  Get regular exercise. This is one of the most important things that you can do for your health. ? Try to exercise for at least 150 minutes each week. The type of exercise that you do should increase your heart rate and make you sweat. This is known as moderate-intensity exercise. ? Try to do strengthening exercises at least twice each week. Do these in addition to the moderate-intensity exercise.  Know your numbers.Ask your health care provider to check your cholesterol and your blood glucose. Continue to have your blood tested as directed by your health care provider.  What should I know about cancer screening? There are several types of cancer. Take the following steps to reduce your risk and to catch any cancer development as early as possible. Breast Cancer  Practice breast self-awareness. ? This means understanding how your breasts normally appear and feel. ? It also means doing regular breast self-exams. Let your health care provider know about any changes, no matter how small.  If you are 40  or older, have a clinician do a breast exam (clinical breast exam or CBE) every year. Depending on your age, family history, and medical history, it may be recommended that you also have a yearly breast X-ray (mammogram).  If you  have a family history of breast cancer, talk with your health care provider about genetic screening.  If you are at high risk for breast cancer, talk with your health care provider about having an MRI and a mammogram every year.  Breast cancer (BRCA) gene test is recommended for women who have family members with BRCA-related cancers. Results of the assessment will determine the need for genetic counseling and BRCA1 and for BRCA2 testing. BRCA-related cancers include these types: ? Breast. This occurs in males or females. ? Ovarian. ? Tubal. This may also be called fallopian tube cancer. ? Cancer of the abdominal or pelvic lining (peritoneal cancer). ? Prostate. ? Pancreatic.  Cervical, Uterine, and Ovarian Cancer Your health care provider may recommend that you be screened regularly for cancer of the pelvic organs. These include your ovaries, uterus, and vagina. This screening involves a pelvic exam, which includes checking for microscopic changes to the surface of your cervix (Pap test).  For women ages 21-65, health care providers may recommend a pelvic exam and a Pap test every three years. For women ages 79-65, they may recommend the Pap test and pelvic exam, combined with testing for human papilloma virus (HPV), every five years. Some types of HPV increase your risk of cervical cancer. Testing for HPV may also be done on women of any age who have unclear Pap test results.  Other health care providers may not recommend any screening for nonpregnant women who are considered low risk for pelvic cancer and have no symptoms. Ask your health care provider if a screening pelvic exam is right for you.  If you have had past treatment for cervical cancer or a condition that could lead to cancer, you need Pap tests and screening for cancer for at least 20 years after your treatment. If Pap tests have been discontinued for you, your risk factors (such as having a new sexual partner) need to be  reassessed to determine if you should start having screenings again. Some women have medical problems that increase the chance of getting cervical cancer. In these cases, your health care provider may recommend that you have screening and Pap tests more often.  If you have a family history of uterine cancer or ovarian cancer, talk with your health care provider about genetic screening.  If you have vaginal bleeding after reaching menopause, tell your health care provider.  There are currently no reliable tests available to screen for ovarian cancer.  Lung Cancer Lung cancer screening is recommended for adults 69-62 years old who are at high risk for lung cancer because of a history of smoking. A yearly low-dose CT scan of the lungs is recommended if you:  Currently smoke.  Have a history of at least 30 pack-years of smoking and you currently smoke or have quit within the past 15 years. A pack-year is smoking an average of one pack of cigarettes per day for one year.  Yearly screening should:  Continue until it has been 15 years since you quit.  Stop if you develop a health problem that would prevent you from having lung cancer treatment.  Colorectal Cancer  This type of cancer can be detected and can often be prevented.  Routine colorectal cancer screening usually begins at  age 42 and continues through age 45.  If you have risk factors for colon cancer, your health care provider may recommend that you be screened at an earlier age.  If you have a family history of colorectal cancer, talk with your health care provider about genetic screening.  Your health care provider may also recommend using home test kits to check for hidden blood in your stool.  A small camera at the end of a tube can be used to examine your colon directly (sigmoidoscopy or colonoscopy). This is done to check for the earliest forms of colorectal cancer.  Direct examination of the colon should be repeated every  5-10 years until age 71. However, if early forms of precancerous polyps or small growths are found or if you have a family history or genetic risk for colorectal cancer, you may need to be screened more often.  Skin Cancer  Check your skin from head to toe regularly.  Monitor any moles. Be sure to tell your health care provider: ? About any new moles or changes in moles, especially if there is a change in a mole's shape or color. ? If you have a mole that is larger than the size of a pencil eraser.  If any of your family members has a history of skin cancer, especially at a young age, talk with your health care provider about genetic screening.  Always use sunscreen. Apply sunscreen liberally and repeatedly throughout the day.  Whenever you are outside, protect yourself by wearing long sleeves, pants, a wide-brimmed hat, and sunglasses.  What should I know about osteoporosis? Osteoporosis is a condition in which bone destruction happens more quickly than new bone creation. After menopause, you may be at an increased risk for osteoporosis. To help prevent osteoporosis or the bone fractures that can happen because of osteoporosis, the following is recommended:  If you are 46-71 years old, get at least 1,000 mg of calcium and at least 600 mg of vitamin D per day.  If you are older than age 55 but younger than age 65, get at least 1,200 mg of calcium and at least 600 mg of vitamin D per day.  If you are older than age 54, get at least 1,200 mg of calcium and at least 800 mg of vitamin D per day.  Smoking and excessive alcohol intake increase the risk of osteoporosis. Eat foods that are rich in calcium and vitamin D, and do weight-bearing exercises several times each week as directed by your health care provider. What should I know about how menopause affects my mental health? Depression may occur at any age, but it is more common as you become older. Common symptoms of depression  include:  Low or sad mood.  Changes in sleep patterns.  Changes in appetite or eating patterns.  Feeling an overall lack of motivation or enjoyment of activities that you previously enjoyed.  Frequent crying spells.  Talk with your health care provider if you think that you are experiencing depression. What should I know about immunizations? It is important that you get and maintain your immunizations. These include:  Tetanus, diphtheria, and pertussis (Tdap) booster vaccine.  Influenza every year before the flu season begins.  Pneumonia vaccine.  Shingles vaccine.  Your health care provider may also recommend other immunizations. This information is not intended to replace advice given to you by your health care provider. Make sure you discuss any questions you have with your health care provider. Document Released: 03/18/2005  Document Revised: 08/14/2015 Document Reviewed: 10/28/2014 Elsevier Interactive Patient Education  2018 [employer]  

## 2016-12-16 NOTE — Progress Notes (Signed)
Patient: Ashley Wallace, Female    DOB: 11-07-1955, 61 y.o.   MRN: [id number] Visit Date: 12/16/2016  Today's Provider: [name], PA-C   Chief Complaint  Patient presents with  . Annual Exam   Subjective:    Annual physical exam Ashley Wallace is a 61 y.o. female who presents today for health maintenance and complete physical. She feels fairly well. She reports exercising. She reports she is sleeping fairly well.  CPE:12/14/15 Pap:12/04/14 Negative Mammogram:01/13/15 BI-RADS 1 Colonoscopy:07/13/11 Int.Hemorrhoids,Diverticulosis. Polyps recheck in 10 years. -----------------------------------------------------------------   Review of Systems  Constitutional: Positive for fatigue.  HENT: Positive for rhinorrhea, sinus pressure, sneezing and sore throat.   Eyes: Negative.   Respiratory: Negative.   Cardiovascular: Negative.   Gastrointestinal: Negative.   Endocrine: Negative.   Genitourinary: Negative.   Musculoskeletal: Positive for arthralgias, back pain, myalgias and neck pain.  Skin: Negative.   Allergic/Immunologic: Negative.   Neurological: Positive for numbness.  Hematological: Negative.   Psychiatric/Behavioral: Negative.     Social History      She  reports that  has never smoked. she has never used smokeless tobacco. She reports that she does not drink alcohol or use drugs.       Social History   Socioeconomic History  . Marital status: Married    Spouse name: None  . Number of children: None  . Years of education: None  . Highest education level: None  Social Needs  . Financial resource strain: None  . Food insecurity - worry: None  . Food insecurity - inability: None  . Transportation needs - medical: None  . Transportation needs - non-medical: None  Occupational History  . None  Tobacco Use  . Smoking status: Never Smoker  . Smokeless tobacco: Never Used  Substance and Sexual Activity  . Alcohol use: No  . Drug use:  No  . Sexual activity: Yes  Other Topics Concern  . None  Social History Narrative  . None    Past Medical History:  Diagnosis Date  . Acid reflux 12/02/2014  . Arthritis   . Headache   . Left thyroid nodule   . Muscle spasms of neck 12/02/2014     Patient Active Problem List   Diagnosis Date Noted  . Dermatitis, eczematoid 12/02/2014  . GERD (gastroesophageal reflux disease) 12/02/2014  . 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 Pre-diabetes 12/02/2014  . Pain in shoulder 12/02/2014  . Thyroid nodule 12/02/2014  . Tinea corporis 12/02/2014  . 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 Allergic rhinitis 07/14/2009  . Disorder of eustachian tube 03/26/2009  . AD (atopic dermatitis) 03/26/2009  . Chronic infection of sinus 12/27/2007  . Insomnia 12/27/2007  . Disturbance of skin sensation 08/23/2006  . Multilevel degenerative disc disease 02/07/1998    Past Surgical History:  Procedure Laterality Date  . CHOLECYSTECTOMY  1999  . CORONARY ARTERY BYPASS GRAFT  1999  . tosillectomy    . TUBAL LIGATION      Family History        Family Status  Relation Name Status  . Mother  Deceased at age 66       cause of death was Failure To Thrive  . Father  Deceased       Cause of Death was cancer-had a brain tumor, lung cancer and Emphysema  . Mat Aunt  (Not Specified)  . Sister  Alive  . Brother  Alive  . Daughter  Alive  . Son  Alive  . Sister  Alive  . Brother  Deceased at age 59       lymph  . Brother  Alive  . Brother  Deceased       work related accident  . Brother  Deceased       shot  . Brother  Alive        Her family history includes Breast cancer in her maternal aunt; Cancer in her brother and brother; Diabetes in her mother, sister, and sister; Hypertension in her brother, brother, mother, sister, and sister; Leukemia in her brother.     No Known Allergies   Current Outpatient Medications:  .  fluticasone (FLONASE) 50 MCG/ACT nasal spray, Place 2 sprays into both  nostrils daily., Disp: 16 g, Rfl: 6 .  ibuprofen (ADVIL,MOTRIN) 200 MG tablet, Take by mouth., Disp: , Rfl:  .  loratadine (CLARITIN) 10 MG tablet, Take 10 mg by mouth daily., Disp: , Rfl:  .  NONFORMULARY OR COMPOUNDED ITEM, [facility] compound - Anti inflammatory Cream:  Diclofenac 3%, Baclofen 2%, Cyclobenzaprine 2%, Lidocaine 2% dispense 120 gram apply 1-2 grams to affected area 3-4 times daily, +2refills., Disp: 480 each, Rfl: 11 .  meclizine (ANTIVERT) 25 MG tablet, Take 0.5-1 tablets (12.5-25 mg total) by mouth 3 (three) times daily as needed for dizziness. (Patient not taking: Reported on 06/20/2016), Disp: 30 tablet, Rfl: 0 .  predniSONE (STERAPRED UNI-PAK 21 TAB) 10 MG (21) TBPK tablet, Take as directed on package instructions (Patient not taking: Reported on 12/16/2016), Disp: 21 tablet, Rfl: 0 .  zolpidem (AMBIEN CR) 6.25 MG CR tablet, Take 1 tablet (6.25 mg total) by mouth at bedtime as needed. for sleep (Patient not taking: Reported on 02/18/2016), Disp: 30 tablet, Rfl: 5   Patient Care Team: Margaretann LovelessBurnette, Jennifer M, PA-C as PCP - General (Family Medicine)      Objective:   Vitals: BP 120/80 (BP Location: Left Arm, Patient Position: Sitting, Cuff Size: Normal)   Pulse 94   Temp (!) 97.4 F (36.3 C) (Oral)   Resp 16   Ht 5\' 2"  (1.575 m)   Wt 143 lb (64.9 kg)   SpO2 97%   BMI 26.16 kg/m     Physical Exam  Constitutional: She is oriented to person, place, and time. She appears well-developed and well-nourished. No distress.  HENT:  Head: Normocephalic and atraumatic.  Right Ear: External ear normal.  Left Ear: External ear normal.  Nose: Nose normal.  Mouth/Throat: Oropharynx is clear and moist. No oropharyngeal exudate.  Eyes: Conjunctivae and EOM are normal. Pupils are equal, round, and reactive to light. Right eye exhibits no discharge. Left eye exhibits no discharge. No scleral icterus.  Neck: Normal range of motion. Neck supple. No JVD present. Carotid bruit is  not present. No tracheal deviation present. No thyromegaly present.  Cardiovascular: Normal rate, regular rhythm, normal heart sounds and intact distal pulses. Exam reveals no gallop and no friction rub.  No murmur heard. Pulmonary/Chest: Effort normal and breath sounds normal. No respiratory distress. She has no decreased breath sounds. She has no wheezes. She has no rales. She exhibits no tenderness. Right breast exhibits no inverted nipple, no mass, no nipple discharge, no skin change and no tenderness. Left breast exhibits no inverted nipple, no mass, no nipple discharge, no skin change and no tenderness. Breasts are symmetrical.  Abdominal: Soft. Bowel sounds are normal. She exhibits no distension and no mass. There is no tenderness. There is no rebound and no  guarding.  Musculoskeletal: Normal range of motion. She exhibits no edema or tenderness.  Lymphadenopathy:    She has no cervical adenopathy.  Neurological: She is alert and oriented to person, place, and time. She has normal reflexes.  Skin: Skin is warm and dry. No rash noted. She is not diaphoretic.  Psychiatric: She has a normal mood and affect. Her behavior is normal. Judgment and thought content normal.  Vitals reviewed.    Depression Screen PHQ 2/9 Scores 12/16/2016 12/14/2015  PHQ - 2 Score 0 0      Assessment & Plan:     Routine Health Maintenance and Physical Exam  Exercise Activities and Dietary recommendations Goals    None      Immunization History  Administered Date(s) Administered  . Tdap 09/05/2012    Health Maintenance  Topic Date Due  . Hepatitis C Screening  [birth year]  . INFLUENZA VACCINE  09/07/2016  . MAMMOGRAM  01/12/2017  . PAP SMEAR  12/03/2017  . COLONOSCOPY  07/12/2021  . TETANUS/TDAP  09/06/2022  . HIV Screening  Discontinued     Discussed health benefits of physical activity, and encouraged her to engage in regular exercise appropriate for her age and condition.    1. Annual  physical exam Normal physical exam today. Will check labs as below and f/u pending lab results. If labs are stable and WNL she will not need to have these rechecked for one year at her next annual physical exam. She is to call the office in the meantime if she has any acute issue, questions or concerns. - CBC with Differential/Platelet - Comprehensive metabolic panel  2. Breast cancer screening Breast exam today was normal. There is no family history of breast cancer. She does perform regular self breast exams. Mammogram was ordered as below. Information [facility] was given to patient so she may schedule her mammogram at her convenience. - MM Digital Screening; Future  3. Thyroid nodule Stable. Will check labs as below and f/u pending results. - CBC with Differential/Platelet - Comprehensive metabolic panel - TSH  4. Pre-diabetes Will check labs as below and f/u pending results. - CBC with Differential/Platelet - Comprehensive metabolic panel - Hemoglobin A1c  5. Hypercholesterolemia Stable. Diet controlled. Will check labs as below and f/u pending results. - CBC with Differential/Platelet - Comprehensive metabolic panel - Lipid panel  6. Need for hepatitis C screening test - Hepatitis C Antibody  --------------------------------------------------------------------    Margaretann LovelessJennifer M Burnette, PA-C  [facility]

## 2016-12-17 LAB — COMPLETE METABOLIC PANEL WITH GFR
AG RATIO: 1.6 (calc) (ref 1.0–2.5)
ALBUMIN MSPROF: 4.1 g/dL (ref 3.6–5.1)
ALT: 11 U/L (ref 6–29)
AST: 18 U/L (ref 10–35)
Alkaline phosphatase (APISO): 80 U/L (ref 33–130)
BUN: 10 mg/dL (ref 7–25)
CALCIUM: 9.3 mg/dL (ref 8.6–10.4)
CO2: 28 mmol/L (ref 20–32)
CREATININE: 0.75 mg/dL (ref 0.50–1.05)
Chloride: 106 mmol/L (ref 98–110)
GFR, EST NON AFRICAN AMERICAN: 87 mL/min/{1.73_m2} (ref >=60)
GFR, Est African American: 101 mL/min/{1.73_m2} (ref >=60)
GLOBULIN: 2.5 g/dL (ref 1.9–3.7)
Glucose, Bld: 101 mg/dL — ABNORMAL HIGH (ref 65–99)
POTASSIUM: 4.1 mmol/L (ref 3.5–5.3)
SODIUM: 140 mmol/L (ref 135–146)
Total Bilirubin: 0.8 mg/dL (ref 0.2–1.2)
Total Protein: 6.6 g/dL (ref 6.1–8.1)

## 2016-12-17 LAB — CBC WITH DIFFERENTIAL/PLATELET
Basophils Absolute: 53 cells/uL (ref 0–200)
Basophils Relative: 0.9 %
EOS PCT: 4.6 %
Eosinophils Absolute: 271 cells/uL (ref 15–500)
HEMATOCRIT: 41.7 % (ref 35.0–45.0)
HEMOGLOBIN: 14.2 g/dL (ref 11.7–15.5)
LYMPHS ABS: 1186 {cells}/uL (ref 850–3900)
MCH: 28.9 pg (ref 27.0–33.0)
MCHC: 34.1 g/dL (ref 32.0–36.0)
MCV: 84.8 fL (ref 80.0–100.0)
MONOS PCT: 9.9 %
MPV: 10 fL (ref 7.5–12.5)
NEUTROS ABS: 3806 {cells}/uL (ref 1500–7800)
Neutrophils Relative %: 64.5 %
Platelets: 281 10*3/uL (ref 140–400)
RBC: 4.92 10*6/uL (ref 3.80–5.10)
RDW: 12.4 % (ref 11.0–15.0)
Total Lymphocyte: 20.1 %
WBC mixed population: 584 cells/uL (ref 200–950)
WBC: 5.9 10*3/uL (ref 3.8–10.8)

## 2016-12-17 LAB — LIPID PANEL
CHOL/HDL RATIO: 3.2 (calc) (ref <5.0)
CHOLESTEROL: 190 mg/dL (ref <200)
HDL: 60 mg/dL (ref >50)
LDL Cholesterol (Calc): 109 mg/dL (calc) — ABNORMAL HIGH
NON-HDL CHOLESTEROL (CALC): 130 mg/dL — AB (ref <130)
TRIGLYCERIDES: 103 mg/dL (ref <150)

## 2016-12-17 LAB — HEMOGLOBIN A1C
EAG (MMOL/L): 6.2 (calc)
Hgb A1c MFr Bld: 5.5 % of total Hgb (ref <5.7)
MEAN PLASMA GLUCOSE: 111 (calc)

## 2016-12-17 LAB — TSH: TSH: 0.51 mIU/L (ref 0.40–4.50)

## 2016-12-17 LAB — HEPATITIS C ANTIBODY
Hepatitis C Ab: NONREACTIVE
SIGNAL TO CUT-OFF: 0 (ref <1.00)

## 2016-12-19 ENCOUNTER — Telehealth: Payer: Self-pay

## 2016-12-19 NOTE — Telephone Encounter (Signed)
Patient advised as directed below.  Thanks,  -Azalya Galyon 

## 2016-12-19 NOTE — Telephone Encounter (Signed)
-----   Message from Margaretann LovelessJennifer M Burnette, PA-C sent at 12/19/2016 11:06 AM EST ----- Cholesterol improved from last year. All other labs are WNL.

## 2017-05-15 ENCOUNTER — Ambulatory Visit: Payer: BLUE CROSS/BLUE SHIELD | Admitting: Family Medicine

## 2017-05-15 ENCOUNTER — Encounter: Payer: Self-pay | Admitting: Family Medicine

## 2017-05-15 VITALS — BP 128/86 | HR 88 | Temp 97.8°F | Resp 16 | Wt 150.0 lb

## 2017-05-15 DIAGNOSIS — M26609 Unspecified temporomandibular joint disorder, unspecified side: Secondary | ICD-10-CM

## 2017-05-15 DIAGNOSIS — K5900 Constipation, unspecified: Secondary | ICD-10-CM

## 2017-05-15 NOTE — Patient Instructions (Signed)
High-Fiber Diet  Fiber, also called dietary fiber, is a type of carbohydrate found in fruits, vegetables, whole grains, and beans. A high-fiber diet can have many health benefits. Your health care provider may recommend a high-fiber diet to help:  · Prevent constipation. Fiber can make your bowel movements more regular.  · Lower your cholesterol.  · Relieve hemorrhoids, uncomplicated diverticulosis, or irritable bowel syndrome.  · Prevent overeating as part of a weight-loss plan.  · Prevent heart disease, type 2 diabetes, and certain cancers.    What is my plan?  The recommended daily intake of fiber includes:  · 38 grams for men under age 50.  · 30 grams for men over age 50.  · 25 grams for women under age 50.  · 21 grams for women over age 50.    You can get the recommended daily intake of dietary fiber by eating a variety of fruits, vegetables, grains, and beans. Your health care provider may also recommend a fiber supplement if it is not possible to get enough fiber through your diet.  What do I need to know about a high-fiber diet?  · Fiber supplements have not been widely studied for their effectiveness, so it is better to get fiber through food sources.  · Always check the fiber content on the nutrition facts label of any prepackaged food. Look for foods that contain at least 5 grams of fiber per serving.  · Ask your dietitian if you have questions about specific foods that are related to your condition, especially if those foods are not listed in the following section.  · Increase your daily fiber consumption gradually. Increasing your intake of dietary fiber too quickly may cause bloating, cramping, or gas.  · Drink plenty of water. Water helps you to digest fiber.  What foods can I eat?  Grains  Whole-grain breads. Multigrain cereal. Oats and oatmeal. Brown rice. Barley. Bulgur wheat. Millet. Bran muffins. Popcorn. Rye wafer crackers.  Vegetables   Sweet potatoes. Spinach. Kale. Artichokes. Cabbage. Broccoli. Green peas. Carrots. Squash.  Fruits  Berries. Pears. Apples. Oranges. Avocados. Prunes and raisins. Dried figs.  Meats and Other Protein Sources  Navy, kidney, pinto, and soy beans. Split peas. Lentils. Nuts and seeds.  Dairy  Fiber-fortified yogurt.  Beverages  Fiber-fortified soy milk. Fiber-fortified orange juice.  Other  Fiber bars.  The items listed above may not be a complete list of recommended foods or beverages. Contact your dietitian for more options.  What foods are not recommended?  Grains  White bread. Pasta made with refined flour. White rice.  Vegetables  Fried potatoes. Canned vegetables. Well-cooked vegetables.  Fruits  Fruit juice. Cooked, strained fruit.  Meats and Other Protein Sources  Fatty cuts of meat. Fried poultry or fried fish.  Dairy  Milk. Yogurt. Cream cheese. Sour cream.  Beverages  Soft drinks.  Other  Cakes and pastries. Butter and oils.  The items listed above may not be a complete list of foods and beverages to avoid. Contact your dietitian for more information.  What are some tips for including high-fiber foods in my diet?  · Eat a wide variety of high-fiber foods.  · Make sure that half of all grains consumed each day are whole grains.  · Replace breads and cereals made from refined flour or white flour with whole-grain breads and cereals.  · Replace white rice with brown rice, bulgur wheat, or millet.  · Start the day with a breakfast that is high in fiber,   such as a cereal that contains at least 5 grams of fiber per serving.  · Use beans in place of meat in soups, salads, or pasta.  · Eat high-fiber snacks, such as berries, raw vegetables, nuts, or popcorn.  This information is not intended to replace advice given to you by your health care provider. Make sure you discuss any questions you have with your health care provider.  Document Released: 01/24/2005 Document Revised: 07/02/2015 Document Reviewed: 07/09/2013   Elsevier Interactive Patient Education © 2018 Elsevier Inc[employer]

## 2017-05-15 NOTE — Progress Notes (Signed)
Patient: Ashley Wallace Female    DOB: Jun 23, 1955   62 y.o.   MRN: [id number] Visit Date: 05/15/2017  Today's Provider: Shirlee Latch, MD   I, Joslyn Hy, CMA, am acting as scribe for Shirlee Latch, MD.  Chief Complaint  Patient presents with  . Abdominal Pain   Subjective:    Abdominal Pain  This is a new problem. Episode onset: x 1 week. The onset quality is gradual. The problem has been waxing and waning. Pain location: right side; also noticed some epigastric pain this morning. The quality of the pain is aching. The abdominal pain does not radiate. Associated symptoms include constipation (has not had BM in 4 days. She states she took an herbal laxative for this, without relief. She has also tried Miralax, without relief.), flatus, headaches (some) and nausea (sometimes). Pertinent negatives include no anorexia, belching, diarrhea, dysuria, fever, frequency, hematochezia, hematuria, myalgias, vomiting or weight loss. Nothing aggravates the pain. The pain is relieved by nothing.   Pt is also c/o left sided jaw pain that radiates into her ear. This has been occurring x 1 month. Is sometimes exacerbated by eating.    No Known Allergies   Current Outpatient Medications:  .  fluticasone (FLONASE) 50 MCG/ACT nasal spray, Place 2 sprays into both nostrils daily., Disp: 16 g, Rfl: 6 .  ibuprofen (ADVIL,MOTRIN) 200 MG tablet, Take by mouth., Disp: , Rfl:  .  loratadine (CLARITIN) 10 MG tablet, Take 10 mg by mouth daily., Disp: , Rfl:  .  zolpidem (AMBIEN CR) 6.25 MG CR tablet, Take 1 tablet (6.25 mg total) by mouth at bedtime as needed. for sleep (Patient not taking: Reported on 02/18/2016), Disp: 30 tablet, Rfl: 5  Review of Systems  Constitutional: Negative for fever and weight loss.  Gastrointestinal: Positive for abdominal pain, constipation (has not had BM in 4 days. She states she took an herbal laxative for this, without relief. She has also tried Miralax,  without relief.), flatus and nausea (sometimes). Negative for anorexia, diarrhea, hematochezia and vomiting.  Genitourinary: Negative for dysuria, frequency and hematuria.  Musculoskeletal: Negative for myalgias.  Neurological: Positive for headaches (some).    Social History   Tobacco Use  . Smoking status: Never Smoker  . Smokeless tobacco: Never Used  Substance Use Topics  . Alcohol use: No   Objective:   BP 128/86 (BP Location: Left Arm, Patient Position: Sitting, Cuff Size: Normal)   Pulse 88   Temp 97.8 F (36.6 C) (Oral)   Resp 16   Wt 150 lb (68 kg)   SpO2 98%   BMI 27.44 kg/m  Vitals:   05/15/17 1105  BP: 128/86  Pulse: 88  Resp: 16  Temp: 97.8 F (36.6 C)  TempSrc: Oral  SpO2: 98%  Weight: 150 lb (68 kg)     Physical Exam  Constitutional: She is oriented to person, place, and time. She appears well-developed and well-nourished. No distress.  HENT:  Head: Normocephalic and atraumatic.  Mouth/Throat: Uvula is midline and oropharynx is clear and moist. No trismus in the jaw.  TTP over TMJ with clicking at joint with opening of mouth  Cardiovascular: Normal rate and regular rhythm.  No murmur heard. Pulmonary/Chest: Effort normal and breath sounds normal. She has no wheezes.  Abdominal: Soft. Normal appearance and bowel sounds are normal. She exhibits no distension and no mass. There is tenderness in the right lower quadrant, periumbilical area, suprapubic area and left lower quadrant. There is no  rebound and no guarding.  Neurological: She is alert and oriented to person, place, and time.  Skin: Skin is warm and dry. Capillary refill takes less than 2 seconds. No rash noted.  Psychiatric: She has a normal mood and affect. Her behavior is normal.  Vitals reviewed.      Assessment & Plan:  1. Constipation, unspecified constipation type - chronic issue - abd exam benign and abd pain seems related to constipation with no BM x4 days - discussed 3 caps of  Miralax in gatorade and drink over next several hours.  May repeat if needed -Return precautions discussed-signs of obstruction -Consider medication such as Amitiza in the future for her chronic constipation  2. TMJ dysfunction -Patient with pain and clicking of left TMJ -Discussed with patient that she should discuss with her dentist about mouth appliances to help with this pain -She may also take anti-inflammatories as needed   Return in about 1 month (around 06/12/2017) for constipation f/u.   The entirety of the information documented in the History of Present Illness, Review of Systems and Physical Exam were personally obtained by me. Portions of this information were initially documented by Irving BurtonEmily Ratchford, CMA and reviewed by me for thoroughness and accuracy.    Erasmo DownerBacigalupo, Ardean Melroy M, MD, MPH [facility] 05/15/2017 3:15 PM

## 2017-10-25 ENCOUNTER — Encounter: Payer: Self-pay | Admitting: Physician Assistant

## 2017-10-25 ENCOUNTER — Ambulatory Visit: Payer: Self-pay | Admitting: Physician Assistant

## 2017-10-25 VITALS — BP 130/80 | HR 101 | Temp 98.2°F | Resp 16 | Wt 153.8 lb

## 2017-10-25 DIAGNOSIS — G629 Polyneuropathy, unspecified: Secondary | ICD-10-CM

## 2017-10-25 DIAGNOSIS — M539 Dorsopathy, unspecified: Secondary | ICD-10-CM

## 2017-10-25 MED ORDER — GABAPENTIN 100 MG PO CAPS: 100.0000 mg | ORAL_CAPSULE | Freq: Three times a day (TID) | ORAL | 1 refills | Status: DC

## 2017-10-25 NOTE — Progress Notes (Signed)
Patient: Ashley Wallace Female    DOB: 1955-09-28   62 y.o.   MRN: [id number] Visit Date: 10/25/2017  Today's Provider: Margaretann LovelessJennifer M Niamya Vittitow, PA-C   Chief Complaint  Patient presents with  . Tingling   Subjective:    HPI  Patient here today with c/o tingling in hands and feet. She reports she has been having this symptoms for a year, but now worsening. Assocoated symptoms:back pain, lower. She reports that it tingles in her calves. Reports that it just happens at any time. Reports that on Sunday she had some swelling on right hand. Does have known history of DDD in cervical spine with neural foramen encroachment noted on cervical spine xray from 2016. Was referred to Dr. Shon BatonBrooks and MRI ordered but she never completed it. Did not f/u with Dr. Shon BatonBrooks after initial visit.   Husband passed in June from small cell lung cancer. Was diagnosed in December 2018. Patient is now without health insurance at this time.     No Known Allergies   Current Outpatient Medications:  .  fluticasone (FLONASE) 50 MCG/ACT nasal spray, Place 2 sprays into both nostrils daily., Disp: 16 g, Rfl: 6 .  ibuprofen (ADVIL,MOTRIN) 200 MG tablet, Take by mouth., Disp: , Rfl:  .  loratadine (CLARITIN) 10 MG tablet, Take 10 mg by mouth daily., Disp: , Rfl:   Review of Systems  Constitutional: Positive for fatigue.  Respiratory: Negative.   Cardiovascular: Negative.   Gastrointestinal: Negative.   Musculoskeletal: Positive for arthralgias, back pain and neck pain.  Neurological: Positive for numbness. Negative for weakness.  Psychiatric/Behavioral: Positive for dysphoric mood.    Social History   Tobacco Use  . Smoking status: Never Smoker  . Smokeless tobacco: Never Used  Substance Use Topics  . Alcohol use: No   Objective:   BP 130/80 (BP Location: Left Arm, Patient Position: Sitting, Cuff Size: Normal)   Pulse (!) 101   Temp 98.2 F (36.8 C) (Oral)   Resp 16   Wt 153 lb 12.8 oz (69.8 kg)    SpO2 97%   BMI 28.13 kg/m  Vitals:   10/25/17 0946  BP: 130/80  Pulse: (!) 101  Resp: 16  Temp: 98.2 F (36.8 C)  TempSrc: Oral  SpO2: 97%  Weight: 153 lb 12.8 oz (69.8 kg)     Physical Exam  Constitutional: She appears well-developed and well-nourished. No distress.  Neck: Normal range of motion. Neck supple. No JVD present. No tracheal deviation present. No thyromegaly present.  Cardiovascular: Normal rate, regular rhythm and normal heart sounds. Exam reveals no gallop and no friction rub.  No murmur heard. Pulmonary/Chest: Effort normal and breath sounds normal. No respiratory distress. She has no wheezes. She has no rales.  Musculoskeletal:       Right hand: Normal.       Left hand: Normal.  Normal grip strength bilaterally. Sensation to light touch and microfilament equal bilaterally. Just feels pins and needles sensation in all fingers bilaterally consistently and occasionally radiates into forearms.   Toes equal sensation bilaterally. Feels intermittent tingling in feet and sometimes up to calf. Does not occur with walking, can occur at rest (no claudication like symptoms). Pulses intact.   Lymphadenopathy:    She has no cervical adenopathy.  Skin: She is not diaphoretic.  Vitals reviewed.       Assessment & Plan:     1. Multilevel degenerative disc disease Will get new imaging as below to  see if there has been significant progression from previous xray. Will start low dose gabapentin as below. I will see her back in 4 weeks and will f/u pending results once received. Call if symptoms worsen.  - DG Cervical Spine Complete; Future - DG Lumbar Spine Complete; Future - gabapentin (NEURONTIN) 100 MG capsule; Take 1 capsule (100 mg total) by mouth 3 (three) times daily.  Dispense: 90 capsule; Refill: 1  2. Neuropathy See above medical treatment plan. - DG Cervical Spine Complete; Future - DG Lumbar Spine Complete; Future - gabapentin (NEURONTIN) 100 MG capsule; Take  1 capsule (100 mg total) by mouth 3 (three) times daily.  Dispense: 90 capsule; Refill: 1       Ashley Loveless, PA-C  [facility]

## 2017-10-25 NOTE — Patient Instructions (Signed)
Gabapentin capsules or tablets What is this medicine? GABAPENTIN (GA ba pen tin) is used to control partial seizures in adults with epilepsy. It is also used to treat certain types of nerve pain. This medicine may be used for other purposes; ask your health care provider or pharmacist if you have questions. COMMON BRAND NAME(S): Active-PAC with Gabapentin, Gabarone, Neurontin What should I tell my health care provider before I take this medicine? They need to know if you have any of these conditions: -kidney disease -suicidal thoughts, plans, or attempt; a previous suicide attempt by you or a family member -an unusual or allergic reaction to gabapentin, other medicines, foods, dyes, or preservatives -pregnant or trying to get pregnant -breast-feeding How should I use this medicine? Take this medicine by mouth with a glass of water. Follow the directions on the prescription label. You can take it with or without food. If it upsets your stomach, take it with food.Take your medicine at regular intervals. Do not take it more often than directed. Do not stop taking except on your doctor's advice. If you are directed to break the 600 or 800 mg tablets in half as part of your dose, the extra half tablet should be used for the next dose. If you have not used the extra half tablet within 28 days, it should be thrown away. A special MedGuide will be given to you by the pharmacist with each prescription and refill. Be sure to read this information carefully each time. Talk to your pediatrician regarding the use of this medicine in children. Special care may be needed. Overdosage: If you think you have taken too much of this medicine contact a poison control center or emergency room at once. NOTE: This medicine is only for you. Do not share this medicine with others. What if I miss a dose? If you miss a dose, take it as soon as you can. If it is almost time for your next dose, take only that dose. Do not  take double or extra doses. What may interact with this medicine? Do not take this medicine with any of the following medications: -other gabapentin products This medicine may also interact with the following medications: -alcohol -antacids -antihistamines for allergy, cough and cold -certain medicines for anxiety or sleep -certain medicines for depression or psychotic disturbances -homatropine; hydrocodone -naproxen -narcotic medicines (opiates) for pain -phenothiazines like chlorpromazine, mesoridazine, prochlorperazine, thioridazine This list may not describe all possible interactions. Give your health care provider a list of all the medicines, herbs, non-prescription drugs, or dietary supplements you use. Also tell them if you smoke, drink alcohol, or use illegal drugs. Some items may interact with your medicine. What should I watch for while using this medicine? Visit your doctor or health care professional for regular checks on your progress. You may want to keep a record at home of how you feel your condition is responding to treatment. You may want to share this information with your doctor or health care professional at each visit. You should contact your doctor or health care professional if your seizures get worse or if you have any new types of seizures. Do not stop taking this medicine or any of your seizure medicines unless instructed by your doctor or health care professional. Stopping your medicine suddenly can increase your seizures or their severity. Wear a medical identification bracelet or chain if you are taking this medicine for seizures, and carry a card that lists all your medications. You may get drowsy, dizzy,   or have blurred vision. Do not drive, use machinery, or do anything that needs mental alertness until you know how this medicine affects you. To reduce dizzy or fainting spells, do not sit or stand up quickly, especially if you are an older patient. Alcohol can  increase drowsiness and dizziness. Avoid alcoholic drinks. Your mouth may get dry. Chewing sugarless gum or sucking hard candy, and drinking plenty of water will help. The use of this medicine may increase the chance of suicidal thoughts or actions. Pay special attention to how you are responding while on this medicine. Any worsening of mood, or thoughts of suicide or dying should be reported to your health care professional right away. Women who become pregnant while using this medicine may enroll in the [place] Antiepileptic Drug Pregnancy Registry by calling 1-888-233-2334. This registry collects information about the safety of antiepileptic drug use during pregnancy. What side effects may I notice from receiving this medicine? Side effects that you should report to your doctor or health care professional as soon as possible: -allergic reactions like skin rash, itching or hives, swelling of the face, lips, or tongue -worsening of mood, thoughts or actions of suicide or dying Side effects that usually do not require medical attention (report to your doctor or health care professional if they continue or are bothersome): -constipation -difficulty walking or controlling muscle movements -dizziness -nausea -slurred speech -tiredness -tremors -weight gain This list may not describe all possible side effects. Call your doctor for medical advice about side effects. You may report side effects to FDA at 1-800-FDA-1088. Where should I keep my medicine? Keep out of reach of children. This medicine may cause accidental overdose and death if it taken by other adults, children, or pets. Mix any unused medicine with a substance like cat litter or coffee grounds. Then throw the medicine away in a sealed container like a sealed bag or a coffee can with a lid. Do not use the medicine after the expiration date. Store at room temperature between 15 and 30 degrees C (59 and 86 degrees F). NOTE: This  sheet is a summary. It may not cover all possible information. If you have questions about this medicine, talk to your doctor, pharmacist, or health care provider.  2018 Elsevier/Gold Standard (2013-03-22 15:26:50)  Neuropathic Pain Neuropathic pain is pain caused by damage to the nerves that are responsible for certain sensations in your body (sensory nerves). The pain can be caused by damage to:  The sensory nerves that send signals to your spinal cord and brain (peripheral nervous system).  The sensory nerves in your brain or spinal cord (central nervous system).  Neuropathic pain can make you more sensitive to pain. What would be a minor sensation for most people may feel very painful if you have neuropathic pain. This is usually a long-term condition that can be difficult to treat. The type of pain can differ from person to person. It may start suddenly (acute), or it may develop slowly and last for a long time (chronic). Neuropathic pain may come and go as damaged nerves heal or may stay at the same level for years. It often causes emotional distress, loss of sleep, and a lower quality of life. What are the causes? The most common cause of damage to a sensory nerve is diabetes. Many other diseases and conditions can also cause neuropathic pain. Causes of neuropathic pain can be classified as:  Toxic. Many drugs and chemicals can cause toxic damage. The most common   cause of toxic neuropathic pain is damage from drug treatment for cancer (chemotherapy).  Metabolic. This type of pain can happen when a disease causes imbalances that damage nerves. Diabetes is the most common of these diseases. Vitamin B deficiency caused by long-term alcohol abuse is another common cause.  Traumatic. Any injury that cuts, crushes, or stretches a nerve can cause damage and pain. A common example is feeling pain after losing an arm or leg (phantom limb pain).  Compression-related. If a sensory nerve gets trapped  or compressed for a long period of time, the blood supply to the nerve can be cut off.  Vascular. Many blood vessel diseases can cause neuropathic pain by decreasing blood supply and oxygen to nerves.  Autoimmune. This type of pain results from diseases in which the body's defense system mistakenly attacks sensory nerves. Examples of autoimmune diseases that can cause neuropathic pain include lupus and multiple sclerosis.  Infectious. Many types of viral infections can damage sensory nerves and cause pain. Shingles infection is a common cause of this type of pain.  Inherited. Neuropathic pain can be a symptom of many diseases that are passed down through families (genetic).  What are the signs or symptoms? The main symptom is pain. Neuropathic pain is often described as:  Burning.  Shock-like.  Stinging.  Hot or cold.  Itching.  How is this diagnosed? No single test can diagnose neuropathic pain. Your health care provider will do a physical exam and ask you about your pain. You may use a pain scale to describe how bad your pain is. You may also have tests to see if you have a high sensitivity to pain and to help find the cause and location of any sensory nerve damage. These tests may include:  Imaging studies, such as: ? X-rays. ? CT scan. ? MRI.  Nerve conduction studies to test how well nerve signals travel through your sensory nerves (electrodiagnostic testing).  Stimulating your sensory nerves through electrodes on your skin and measuring the response in your spinal cord and brain (somatosensory evoked potentials).  How is this treated? Treatment for neuropathic pain may change over time. You may need to try different treatment options or a combination of treatments. Some options include:  Over-the-counter pain relievers.  Prescription medicines. Some medicines used to treat other conditions may also help neuropathic pain. These include medicines to: ? Control seizures  (anticonvulsants). ? Relieve depression (antidepressants).  Prescription-strength pain relievers (narcotics). These are usually used when other pain relievers do not help.  Transcutaneous nerve stimulation (TENS). This uses electrical currents to block painful nerve signals. The treatment is painless.  Topical and local anesthetics. These are medicines that numb the nerves. They can be injected as a nerve block or applied to the skin.  Alternative treatments, such as: ? Acupuncture. ? Meditation. ? Massage. ? Physical therapy. ? Pain management programs. ? Counseling.  Follow these instructions at home:  Learn as much as you can about your condition.  Take medicines only as directed by your health care provider.  Work closely with all your health care providers to find what works best for you.  Have a good support system at home.  Consider joining a chronic pain support group. Contact a health care provider if:  Your pain treatments are not helping.  You are having side effects from your medicines.  You are struggling with fatigue, mood changes, depression, or anxiety. This information is not intended to replace advice given to you by your   health care provider. Make sure you discuss any questions you have with your health care provider. Document Released: 10/22/2003 Document Revised: 08/14/2015 Document Reviewed: 07/04/2013 Elsevier Interactive Patient Education  2018 Elsevier Inc.  

## 2017-10-26 ENCOUNTER — Ambulatory Visit
Admission: RE | Admit: 2017-10-26 | Discharge: 2017-10-26 | Disposition: A | Discharge status: Home / Self Care | Payer: Self-pay | Source: Ambulatory Visit | Attending: Physician Assistant | Admitting: Physician Assistant

## 2017-10-26 ENCOUNTER — Telehealth: Payer: Self-pay

## 2017-10-26 DIAGNOSIS — G629 Polyneuropathy, unspecified: Secondary | ICD-10-CM

## 2017-10-26 DIAGNOSIS — M539 Dorsopathy, unspecified: Secondary | ICD-10-CM

## 2017-10-26 DIAGNOSIS — M47892 Other spondylosis, cervical region: Secondary | ICD-10-CM | POA: Insufficient documentation

## 2017-10-26 DIAGNOSIS — M2578 Osteophyte, vertebrae: Secondary | ICD-10-CM | POA: Insufficient documentation

## 2017-10-26 DIAGNOSIS — M5136 Other intervertebral disc degeneration, lumbar region: Secondary | ICD-10-CM | POA: Insufficient documentation

## 2017-10-26 NOTE — Telephone Encounter (Signed)
Patient advised as directed below.  Thanks,  -Trew Sunde 

## 2017-10-26 NOTE — Telephone Encounter (Signed)
-----   Message from Margaretann LovelessJennifer M Burnette, [place]C sent at 10/26/2017  4:03 PM EDT ----- Neck xray is not much changed from previous in 2016. 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 Again these could be exacerbating the symptoms. We could manage with anti-inflammatories and the medication you are on for nerve pain already but would most likely benefit from a referral to a orthopedic spine specialist.

## 2017-10-26 NOTE — Telephone Encounter (Signed)
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 It is significant enough that it also may be compressing nerve roots. Again can manage conservatively as discussed with cervical spine but would most likely benefit from a referral to ortho spine specialist once you have insurance again.

## 2018-02-13 ENCOUNTER — Encounter: Payer: Self-pay | Admitting: Physician Assistant

## 2018-02-13 ENCOUNTER — Ambulatory Visit (INDEPENDENT_AMBULATORY_CARE_PROVIDER_SITE_OTHER): Payer: BLUE CROSS/BLUE SHIELD | Admitting: Physician Assistant

## 2018-02-13 VITALS — BP 159/95 | HR 103 | Temp 98.0°F | Resp 16 | Wt 151.0 lb

## 2018-02-13 DIAGNOSIS — N3001 Acute cystitis with hematuria: Secondary | ICD-10-CM

## 2018-02-13 DIAGNOSIS — R350 Frequency of micturition: Secondary | ICD-10-CM | POA: Diagnosis not present

## 2018-02-13 LAB — POCT URINALYSIS DIPSTICK
Appearance: ABNORMAL
BILIRUBIN UA: NEGATIVE
GLUCOSE UA: NEGATIVE
KETONES UA: NEGATIVE
Nitrite, UA: NEGATIVE
Odor: NORMAL
PH UA: 6 (ref 5.0–8.0)
Protein, UA: POSITIVE — AB
Spec Grav, UA: 1.015 (ref 1.010–1.025)
UROBILINOGEN UA: 2 U/dL — AB

## 2018-02-13 MED ORDER — SULFAMETHOXAZOLE-TRIMETHOPRIM 800-160 MG PO TABS: 1.0000 | ORAL_TABLET | Freq: Two times a day (BID) | ORAL | 0 refills | Status: DC

## 2018-02-13 NOTE — Patient Instructions (Signed)
Urinary Tract Infection, Adult A urinary tract infection (UTI) is an infection of any part of the urinary tract. The urinary tract includes:  The kidneys.  The ureters.  The bladder.  The urethra. These organs make, store, and get rid of pee (urine) in the body. What are the causes? This is caused by germs (bacteria) in your genital area. These germs grow and cause swelling (inflammation) of your urinary tract. What increases the risk? You are more likely to develop this condition if:  You have a small, thin tube (catheter) to drain pee.  You cannot control when you pee or poop (incontinence).  You are female, and: ? You use these methods to prevent pregnancy: ? A medicine that kills sperm (spermicide). ? A device that blocks sperm (diaphragm). ? You have low levels of a female hormone (estrogen). ? You are pregnant.  You have genes that add to your risk.  You are sexually active.  You take antibiotic medicines.  You have trouble peeing because of: ? A prostate that is bigger than normal, if you are female. ? A blockage in the part of your body that drains pee from the bladder (urethra). ? A kidney stone. ? A nerve condition that affects your bladder (neurogenic bladder). ? Not getting enough to drink. ? Not peeing often enough.  You have other conditions, such as: ? Diabetes. ? A weak disease-fighting system (immune system). ? Sickle cell disease. ? Gout. ? Injury of the spine. What are the signs or symptoms? Symptoms of this condition include:  Needing to pee right away (urgently).  Peeing often.  Peeing small amounts often.  Pain or burning when peeing.  Blood in the pee.  Pee that smells bad or not like normal.  Trouble peeing.  Pee that is cloudy.  Fluid coming from the vagina, if you are female.  Pain in the belly or lower back. Other symptoms include:  Throwing up (vomiting).  No urge to eat.  Feeling mixed up (confused).  Being tired  and grouchy (irritable).  A fever.  Watery poop (diarrhea). How is this treated? This condition may be treated with:  Antibiotic medicine.  Other medicines.  Drinking enough water. Follow these instructions at home:  Medicines  Take over-the-counter and prescription medicines only as told by your doctor.  If you were prescribed an antibiotic medicine, take it as told by your doctor. Do not stop taking it even if you start to feel better. General instructions  Make sure you: ? Pee until your bladder is empty. ? Do not hold pee for a long time. ? Empty your bladder after sex. ? Wipe from front to back after pooping if you are a female. Use each tissue one time when you wipe.  Drink enough fluid to keep your pee pale yellow.  Keep all follow-up visits as told by your doctor. This is important. Contact a doctor if:  You do not get better after 1-2 days.  Your symptoms go away and then come back. Get help right away if:  You have very bad back pain.  You have very bad pain in your lower belly.  You have a fever.  You are sick to your stomach (nauseous).  You are throwing up. Summary  A urinary tract infection (UTI) is an infection of any part of the urinary tract.  This condition is caused by germs in your genital area.  There are many risk factors for a UTI. These include having a small, thin   tube to drain pee and not being able to control when you pee or poop.  Treatment includes antibiotic medicines for germs.  Drink enough fluid to keep your pee pale yellow. This information is not intended to replace advice given to you by your health care provider. Make sure you discuss any questions you have with your health care provider. Document Released: 07/13/2007 Document Revised: 08/03/2017 Document Reviewed: 08/03/2017 Elsevier Interactive Patient Education  2019 Elsevier Inc.  

## 2018-02-13 NOTE — Progress Notes (Signed)
Patient: Ashley ArdsMargaret O Johnston Female    DOB: [birth year]   63 y.o.   MRN: [id number] Visit Date: 02/13/2018  Today's Provider: Margaretann LovelessJennifer M , PA-C   No chief complaint on file.  Subjective:     HPI   Patient states she has had urinary frequency for over a year now. Patient states symptoms have worsened lately. Patient states she has had increased urination over the past 2 months. Patient states over the past weekend she starting having low abdominal pain and a pink/brown discharge. Patient stated she has had right flank pain today. Patient stated she urinated today and her urine was a brown stream.    No Known Allergies   Current Outpatient Medications:  .  fluticasone (FLONASE) 50 MCG/ACT nasal spray, Place 2 sprays into both nostrils daily., Disp: 16 g, Rfl: 6 .  gabapentin (NEURONTIN) 100 MG capsule, Take 1 capsule (100 mg total) by mouth 3 (three) times daily., Disp: 90 capsule, Rfl: 1 .  ibuprofen (ADVIL,MOTRIN) 200 MG tablet, Take by mouth., Disp: , Rfl:  .  loratadine (CLARITIN) 10 MG tablet, Take 10 mg by mouth daily., Disp: , Rfl:   Review of Systems  Constitutional: Negative for appetite change, chills, fatigue and fever.  Respiratory: Negative for chest tightness and shortness of breath.   Cardiovascular: Negative for chest pain and palpitations.  Gastrointestinal: Negative for abdominal pain, nausea and vomiting.  Genitourinary: Positive for dysuria, flank pain, frequency, hematuria, pelvic pain and urgency.  Neurological: Negative for dizziness and weakness.    Social History   Tobacco Use  . Smoking status: Never Smoker  . Smokeless tobacco: Never Used  Substance Use Topics  . Alcohol use: No      Objective:   BP (!) 159/95 (BP Location: Left Arm, Patient Position: Sitting, Cuff Size: Normal)   Pulse (!) 103   Temp 98 F (36.7 C) (Oral)   Resp 16   Wt 151 lb (68.5 kg)   SpO2 97%   BMI 27.62 kg/m  Vitals:   02/13/18 1750  BP: (!) 159/95    Pulse: (!) 103  Resp: 16  Temp: 98 F (36.7 C)  TempSrc: Oral  SpO2: 97%  Weight: 151 lb (68.5 kg)     Physical Exam Constitutional:      General: She is not in acute distress.    Appearance: Normal appearance. She is well-developed. She is not diaphoretic.  Cardiovascular:     Rate and Rhythm: Normal rate and regular rhythm.     Heart sounds: Normal heart sounds. No murmur. No friction rub. No gallop.   Pulmonary:     Effort: Pulmonary effort is normal. No respiratory distress.     Breath sounds: Normal breath sounds. No wheezing or rales.  Abdominal:     General: Bowel sounds are normal. There is no distension.     Palpations: Abdomen is soft. There is no mass.     Tenderness: There is abdominal tenderness in the suprapubic area. There is no guarding or rebound.  Skin:    General: Skin is warm and dry.  Neurological:     Mental Status: She is alert and oriented to person, place, and time.         Assessment & Plan    1. Acute cystitis with hematuria Worsening symptoms. UA positive. Will treat empirically with Bactrim as below. Advised to use cranberry tabs OTC for spasm and pressure.  Continue to push fluids. Urine sent for culture.  Will follow up pending C&S results. She is to call if symptoms do not improve or if they worsen.  - sulfamethoxazole-trimethoprim (BACTRIM DS,SEPTRA DS) 800-160 MG tablet; Take 1 tablet by mouth 2 (two) times daily.  Dispense: 14 tablet; Refill: 0  2. Frequency of urination See above medical treatment plan. - POCT urinalysis dipstick - CULTURE, URINE COMPREHENSIVE     Margaretann LovelessJennifer M , PA-C  [facility]

## 2018-02-18 ENCOUNTER — Emergency Department: Payer: BLUE CROSS/BLUE SHIELD

## 2018-02-18 ENCOUNTER — Emergency Department
Admission: EM | Admit: 2018-02-18 | Discharge: 2018-02-18 | Disposition: A | Discharge status: Home / Self Care | Payer: BLUE CROSS/BLUE SHIELD | Attending: Emergency Medicine | Admitting: Emergency Medicine

## 2018-02-18 ENCOUNTER — Other Ambulatory Visit: Payer: Self-pay

## 2018-02-18 DIAGNOSIS — Z79899 Other long term (current) drug therapy: Secondary | ICD-10-CM | POA: Diagnosis not present

## 2018-02-18 DIAGNOSIS — Z951 Presence of aortocoronary bypass graft: Secondary | ICD-10-CM | POA: Insufficient documentation

## 2018-02-18 DIAGNOSIS — M79652 Pain in left thigh: Secondary | ICD-10-CM

## 2018-02-18 LAB — CBC WITH DIFFERENTIAL/PLATELET
Abs Immature Granulocytes: 0.07 10*3/uL (ref 0.00–0.07)
Basophils Absolute: 0.1 10*3/uL (ref 0.0–0.1)
Basophils Relative: 0 %
Eosinophils Absolute: 0 10*3/uL (ref 0.0–0.5)
Eosinophils Relative: 0 %
HEMATOCRIT: 44.4 % (ref 36.0–46.0)
Hemoglobin: 14.4 g/dL (ref 12.0–15.0)
Immature Granulocytes: 0 %
LYMPHS ABS: 0.9 10*3/uL (ref 0.7–4.0)
Lymphocytes Relative: 6 %
MCH: 28.7 pg (ref 26.0–34.0)
MCHC: 32.4 g/dL (ref 30.0–36.0)
MCV: 88.4 fL (ref 80.0–100.0)
Monocytes Absolute: 1.1 10*3/uL — ABNORMAL HIGH (ref 0.1–1.0)
Monocytes Relative: 7 %
Neutro Abs: 13.5 10*3/uL — ABNORMAL HIGH (ref 1.7–7.7)
Neutrophils Relative %: 87 %
Platelets: 304 10*3/uL (ref 150–400)
RBC: 5.02 MIL/uL (ref 3.87–5.11)
RDW: 12.6 % (ref 11.5–15.5)
WBC: 15.6 10*3/uL — ABNORMAL HIGH (ref 4.0–10.5)
nRBC: 0 % (ref 0.0–0.2)

## 2018-02-18 LAB — BASIC METABOLIC PANEL
Anion gap: 8 (ref 5–15)
BUN: 18 mg/dL (ref 6–20)
CO2: 23 mmol/L (ref 22–32)
Calcium: 9.4 mg/dL (ref 8.9–10.3)
Chloride: 105 mmol/L (ref 98–111)
Creatinine, Ser: 0.82 mg/dL (ref 0.44–1.00)
GFR calc Af Amer: >60 mL/min (ref >60)
GFR calc non Af Amer: >60 mL/min (ref >60)
Glucose, Bld: 127 mg/dL — ABNORMAL HIGH (ref 70–99)
Potassium: 4.3 mmol/L (ref 3.5–5.1)
Sodium: 136 mmol/L (ref 135–145)

## 2018-02-18 LAB — CULTURE, URINE COMPREHENSIVE

## 2018-02-18 LAB — CK: Total CK: 129 U/L (ref 38–234)

## 2018-02-18 MED ORDER — NAPROXEN 500 MG PO TABS: 500.0000 mg | ORAL_TABLET | Freq: Two times a day (BID) | ORAL | 0 refills | Status: AC

## 2018-02-18 MED ORDER — TRAMADOL HCL 50 MG PO TABS
50.0000 mg | ORAL_TABLET | Freq: Once | ORAL | Status: AC
  Administered 2018-02-18: 50 mg via ORAL
  Filled 2018-02-18: qty 1

## 2018-02-18 MED ORDER — CYCLOBENZAPRINE HCL 10 MG PO TABS: 10.0000 mg | ORAL_TABLET | Freq: Three times a day (TID) | ORAL | 0 refills | Status: AC | PRN

## 2018-02-18 NOTE — ED Notes (Signed)
ED Provider at bedside. 

## 2018-02-18 NOTE — ED Provider Notes (Signed)
[facility] Emergency Department Provider Note ____________________________________________   First MD Initiated Contact with Patient 02/18/18 [phone]     (approximate)  I have reviewed the triage vital signs and the nursing notes.   HISTORY  Chief Complaint Leg Pain and Back Pain    HPI Ashley Wallace is a 63 y.o. female with PMH as noted below who presents with left hip and proximal thigh pain, acute onset yesterday, somewhat worsening since then, and worse with bending the hip or lifting the leg.  She denies associated swelling.  She denies numbness or weakness.  She does report some mild tingling, and has had some tingling in her hands as well.  She denies any injury or trauma.  The patient reports she does have some lower back pain as well.  She is currently being treated for UTI and started antibiotics 5 days ago.  No prior history of this pain.   Past Medical History:  Diagnosis Date  . Acid reflux 12/02/2014  . Arthritis   . Headache   . Left thyroid nodule   . Muscle spasms of neck 12/02/2014    Patient Active Problem List   Diagnosis Date Noted  . Hypercholesterolemia 12/16/2016  . Dermatitis, eczematoid 12/02/2014  . GERD (gastroesophageal reflux disease) 12/02/2014  . 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 Pre-diabetes 12/02/2014  . Pain in shoulder 12/02/2014  . Thyroid nodule 12/02/2014  . Tinea corporis 12/02/2014  . 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 Allergic rhinitis 07/14/2009  . Disorder of eustachian tube 03/26/2009  . AD (atopic dermatitis) 03/26/2009  . Chronic infection of sinus 12/27/2007  . Insomnia 12/27/2007  . Disturbance of skin sensation 08/23/2006  . Multilevel degenerative disc disease 02/07/1998    Past Surgical History:  Procedure Laterality Date  . CHOLECYSTECTOMY  1999  . CORONARY ARTERY BYPASS GRAFT  1999  . tosillectomy    . TUBAL LIGATION      Prior to Admission medications   Medication Sig Start Date  End Date Taking? Authorizing Provider  cyclobenzaprine (FLEXERIL) 10 MG tablet Take 1 tablet (10 mg total) by mouth 3 (three) times daily as needed for up to 5 days for muscle spasms. 02/18/18 02/23/18  Dionne BucySiadecki, Haleem Hanner, MD  fluticasone (FLONASE) 50 MCG/ACT nasal spray Place 2 sprays into both nostrils daily. 03/19/15   Margaretann LovelessBurnette, Jennifer M, PA-C  gabapentin (NEURONTIN) 100 MG capsule Take 1 capsule (100 mg total) by mouth 3 (three) times daily. 10/25/17   Margaretann LovelessBurnette, Jennifer M, PA-C  ibuprofen (ADVIL,MOTRIN) 200 MG tablet Take by mouth.    [provider]  loratadine (CLARITIN) 10 MG tablet Take 10 mg by mouth daily.    [provider]  naproxen (NAPROSYN) 500 MG tablet Take 1 tablet (500 mg total) by mouth 2 (two) times daily with a meal for 10 days. 02/18/18 02/28/18  Dionne BucySiadecki, Dani Wallner, MD  sulfamethoxazole-trimethoprim (BACTRIM DS,SEPTRA DS) 800-160 MG tablet Take 1 tablet by mouth 2 (two) times daily. 02/13/18   Margaretann LovelessBurnette, Jennifer M, PA-C    Allergies Patient has no known allergies.  Family History  Problem Relation Age of Onset  . Hypertension Mother   . Diabetes Mother        non-Insulin dependent  . Breast cancer Maternal Aunt   . Diabetes Sister   . Hypertension Sister   . Hypertension Brother   . Diabetes Sister   . Hypertension Sister   . Cancer Brother   . Leukemia Brother   . Cancer Brother  kidney  . Hypertension Brother     Social History Social History   Tobacco Use  . Smoking status: Never Smoker  . Smokeless tobacco: Never Used  Substance Use Topics  . Alcohol use: No  . Drug use: No    Review of Systems  Constitutional: No fever. Eyes: No redness. ENT: No neck pain. Cardiovascular: Denies chest pain. Respiratory: Denies shortness of breath. Gastrointestinal: No abdominal pain.  Genitourinary: Negative for flank pain.  Musculoskeletal: Positive for back pain. Skin: Negative for rash. Neurological: Negative for focal weakness or  numbness.   ____________________________________________   PHYSICAL EXAM:  VITAL SIGNS: ED Triage Vitals [02/18/18 0138]  Enc Vitals Group     BP (!) 103/50     Pulse Rate 84     Resp 18     Temp 99.1 F (37.3 C)     Temp Source Oral     SpO2 100 %     Weight 151 lb (68.5 kg)     Height 5\' 2"  (1.575 m)     Head Circumference      Peak Flow      Pain Score      Pain Loc      Pain Edu?      Excl. in GC?     Constitutional: Alert and oriented. Well appearing and in no acute distress. Eyes: Conjunctivae are normal.  Head: Atraumatic. Nose: No congestion/rhinnorhea. Mouth/Throat: Mucous membranes are moist.   Neck: Normal range of motion.  Cardiovascular: Good peripheral circulation. Respiratory: Normal respiratory effort.   Gastrointestinal: Soft and nontender. No distention.  Genitourinary: No CVA tenderness.  No inguinal lymphadenopathy. Musculoskeletal: Extremities warm and well perfused.  Left anterior proximal thigh with mild tenderness to palpation.  Full range of motion at left hip and knee, however there is pain on flexion at the left hip.  Negative left straight leg raise.  No swelling. Neurologic:  Normal speech and language.  5/5 motor strength and intact sensation to bilateral lower extremities.   Skin:  Skin is warm and dry.  Possible faint erythematous rash to small area of anterior proximal left thigh. Psychiatric: Mood and affect are normal. Speech and behavior are normal.  ____________________________________________   LABS (all labs ordered are listed, but only abnormal results are displayed)  Labs Reviewed  BASIC METABOLIC PANEL - Abnormal; Notable for the following components:      Result Value   Glucose, Bld 127 (*)    All other components within normal limits  CBC WITH DIFFERENTIAL/PLATELET - Abnormal; Notable for the following components:   WBC 15.6 (*)    Neutro Abs 13.5 (*)    Monocytes Absolute 1.1 (*)    All other components within  normal limits  CK   ____________________________________________  EKG   ____________________________________________  RADIOLOGY  XR L hip: No acute abnormality  ____________________________________________   PROCEDURES  Procedure(s) performed: No  Procedures  Critical Care performed: No ____________________________________________   INITIAL IMPRESSION / ASSESSMENT AND PLAN / ED COURSE  Pertinent labs & imaging results that were available during my care of the patient were reviewed by me and considered in my medical decision making (see chart for details).  63 year old female with PMH as noted above presents with left hip and proximal left leg pain as well as some pain in her lower back which is atraumatic.  She has mild tingling but denies any numbness or weakness.    On exam the patient is well-appearing and her vital signs are  normal except for a low-grade temperature.  The left lower extremity is neurologically intact and with good circulation.  She has pain when she lifts the leg or bends at the hip but is able to do so.  There is tenderness to the anterior proximal thigh although no swelling or masses.  There is no inguinal lymphadenopathy.  The abdomen is soft and nontender.  There is possibly a very faint rash in the area of tenderness to the anterior proximal thigh, however the patient states that she had been putting ice there not too long ago so this may be a mild reaction to this.  Overall the presentation and exam are most consistent with muscle strain or spasm given the pain on movement and the tenderness in the area.  Differential would also include sciatica although this would be more likely to be in the back of the leg.  There is no neurologic deficit.  The location of the symptoms is not consistent with a DVT.  It is also possible that there could be an inflammatory or infectious cause such as myositis or possibly very early cellulitis although the clinical exam  does not really fit these either.  I will obtain an x-ray to rule out bony involvement, as well as basic labs and a CK to evaluate for infectious cause.  If these are normal I will treat symptomatically for likely sciatica or muscle strain and have the patient follow-up.  ----------------------------------------- 7:08 AM on 02/18/2018 -----------------------------------------  X-ray is negative.  The lab work-up is unremarkable except for elevated WBC count, however this is consistent with the UTI the patient is already being treated for.  There is no reason to suspect myositis or cellulitis based on the clinical exam.  As above, I continue to suspect most likely sciatica or muscle strain.  I had an extensive discussion with the patient and her family member about the results of the work-up and the likely causes of her symptoms.  We will treat with NSAIDs and Flexeril.  I gave her thorough return precautions and she expressed understanding. ____________________________________________   FINAL CLINICAL IMPRESSION(S) / ED DIAGNOSES  Final diagnoses:  Left thigh pain      NEW MEDICATIONS STARTED DURING THIS VISIT:  New Prescriptions   CYCLOBENZAPRINE (FLEXERIL) 10 MG TABLET    Take 1 tablet (10 mg total) by mouth 3 (three) times daily as needed for up to 5 days for muscle spasms.   NAPROXEN (NAPROSYN) 500 MG TABLET    Take 1 tablet (500 mg total) by mouth 2 (two) times daily with a meal for 10 days.     Note:  This document was prepared using Dragon voice recognition software and may include unintentional dictation errors.    Dionne Bucy, MD 02/18/18 [phone]

## 2018-02-18 NOTE — Discharge Instructions (Addendum)
Your symptoms are most likely related to sciatica or a muscle strain or spasm.  Take the Naprosyn twice daily over the next week, and the Flexeril if needed to help relax the muscle especially at night or when you are at home.  Return to the ER for any new or worsening symptoms including new or worsening pain, weakness or numbness in the leg, swelling to the leg, worsening rash, fever or chills, pain moving to other parts of your body, or any other new or worsening symptoms that concern you.

## 2018-02-18 NOTE — ED Triage Notes (Signed)
Patient reports symptoms began Saturday afternoon.  Reports started with pain in left groin that radiates down leg, also now with back pain and hands tingling.

## 2018-02-19 ENCOUNTER — Encounter: Payer: Self-pay | Admitting: Physician Assistant

## 2018-02-19 ENCOUNTER — Ambulatory Visit: Payer: BLUE CROSS/BLUE SHIELD | Admitting: Physician Assistant

## 2018-02-19 VITALS — BP 110/80 | HR 97 | Temp 98.0°F | Resp 16

## 2018-02-19 DIAGNOSIS — S76012D Strain of muscle, fascia and tendon of left hip, subsequent encounter: Secondary | ICD-10-CM

## 2018-02-19 DIAGNOSIS — N3000 Acute cystitis without hematuria: Secondary | ICD-10-CM

## 2018-02-19 MED ORDER — CEPHALEXIN 500 MG PO CAPS: 500.0000 mg | ORAL_CAPSULE | Freq: Three times a day (TID) | ORAL | 0 refills | Status: DC

## 2018-02-19 NOTE — Patient Instructions (Signed)

## 2018-02-19 NOTE — Progress Notes (Signed)
Patient: Ashley Wallace Female    DOB: [birth year]   63 y.o.   MRN: [id number] Visit Date: 02/19/2018  Today's Provider: [name], PA-C   Chief Complaint  Patient presents with  . Leg Pain   Subjective:     HPI   Patient was seen in ER yesterday for left leg and back pain. X-ray done showing normal left hip.  Labs done showing elevated WBC count. Treated with NSAIDs and Flexeril. Just taken one dose this morning due to not picking up medication from ER until today. Pharmacy was closed on Sunday. Urine frequency still present. Bladder fullness sensation improved. Urine discoloration improving. She did stop Bactrim this morning because she saw that it can cause a rare side effect of myalgias and joint pain.   No Known Allergies   Current Outpatient Medications:  .  cyclobenzaprine (FLEXERIL) 10 MG tablet, Take 1 tablet (10 mg total) by mouth 3 (three) times daily as needed for up to 5 days for muscle spasms., Disp: 15 tablet, Rfl: 0 .  fluticasone (FLONASE) 50 MCG/ACT nasal spray, Place 2 sprays into both nostrils daily., Disp: 16 g, Rfl: 6 .  gabapentin (NEURONTIN) 100 MG capsule, Take 1 capsule (100 mg total) by mouth 3 (three) times daily., Disp: 90 capsule, Rfl: 1 .  ibuprofen (ADVIL,MOTRIN) 200 MG tablet, Take by mouth., Disp: , Rfl:  .  loratadine (CLARITIN) 10 MG tablet, Take 10 mg by mouth daily., Disp: , Rfl:  .  naproxen (NAPROSYN) 500 MG tablet, Take 1 tablet (500 mg total) by mouth 2 (two) times daily with a meal for 10 days., Disp: 20 tablet, Rfl: 0 .  sulfamethoxazole-trimethoprim (BACTRIM DS,SEPTRA DS) 800-160 MG tablet, Take 1 tablet by mouth 2 (two) times daily. (Patient not taking: Reported on 02/19/2018), Disp: 14 tablet, Rfl: 0  Review of Systems  Constitutional: Negative for appetite change, chills, fatigue and fever.  Respiratory: Negative for chest tightness and shortness of breath.   Cardiovascular: Negative for chest pain and palpitations.    Gastrointestinal: Negative for abdominal pain, nausea and vomiting.  Genitourinary: Positive for frequency. Negative for dysuria and flank pain.  Musculoskeletal: Positive for arthralgias (left hip).  Neurological: Negative for dizziness and weakness.    Social History   Tobacco Use  . Smoking status: Never Smoker  . Smokeless tobacco: Never Used  Substance Use Topics  . Alcohol use: No      Objective:   BP 110/80 (BP Location: Left Arm, Patient Position: Sitting, Cuff Size: Large)   Pulse 97   Temp 98 F (36.7 C) (Oral)   Resp 16   SpO2 99%  Vitals:   02/19/18 1524  BP: 110/80  Pulse: 97  Resp: 16  Temp: 98 F (36.7 C)  TempSrc: Oral  SpO2: 99%     Physical Exam Constitutional:      General: She is not in acute distress.    Appearance: Normal appearance. She is well-developed. She is not diaphoretic.  Cardiovascular:     Rate and Rhythm: Normal rate and regular rhythm.     Heart sounds: Normal heart sounds. No murmur. No friction rub. No gallop.   Pulmonary:     Effort: Pulmonary effort is normal. No respiratory distress.     Breath sounds: Normal breath sounds. No wheezing or rales.  Abdominal:     General: Bowel sounds are normal. There is no distension.     Palpations: Abdomen is soft. There is no  mass.     Tenderness: There is no abdominal tenderness. There is no guarding or rebound.  Musculoskeletal:     Left hip: She exhibits decreased range of motion (AROM hip flexion limited, PROM normal), decreased strength (hip flexion 4/5) and tenderness (over hip flexors).  Skin:    General: Skin is warm and dry.  Neurological:     Mental Status: She is alert and oriented to person, place, and time.        Assessment & Plan    1. Acute cystitis without hematuria Stop Bactrim. Change to keflex as below. Call if no improvements.  - cephALEXin (KEFLEX) 500 MG capsule; Take 1 capsule (500 mg total) by mouth 3 (three) times daily.  Dispense: 15 capsule; Refill:  0  2. Strain of flexor muscle of left hip, subsequent encounter Continue muscle relaxer and Aleve given by ER. If no improvements or symptoms worsen she is to call the office and will consider medrol dose pak instead.      [name], PA-C  [facility]

## 2018-03-02 NOTE — Progress Notes (Signed)
Patient: Ashley Wallace, Female    DOB: 01-22-1956, 63 y.o.   MRN: [id number] Visit Date: 03/05/2018  Today's Provider: Margaretann LovelessJennifer M Hovanes Hymas, PA-C   Chief Complaint  Patient presents with  . Annual Exam   Subjective:    Annual physical exam Ashley ArdsMargaret O Wallace is a 63 y.o. female who presents today for health maintenance and complete physical. She feels fairly well. She reports exercising yes some. She reports she is sleeping fairly well. -----------------------------------------------------------------   Review of Systems  Constitutional: Negative.   HENT: Negative.   Eyes: Negative.   Respiratory: Negative.   Cardiovascular: Negative.   Gastrointestinal: Positive for constipation.  Endocrine: Negative.   Genitourinary: Positive for enuresis.  Musculoskeletal: Positive for arthralgias, back pain and neck pain.  Skin: Negative.   Allergic/Immunologic: Negative.   Neurological: Negative.   Hematological: Negative.   Psychiatric/Behavioral: Negative.     Social History      She  reports that she has never smoked. She has never used smokeless tobacco. She reports that she does not drink alcohol or use drugs.       Social History   Socioeconomic History  . Marital status: Widowed    Spouse name: Not on file  . Number of children: Not on file  . Years of education: Not on file  . Highest education level: Not on file  Occupational History  . Not on file  Social Needs  . Financial resource strain: Not on file  . Food insecurity:    Worry: Not on file    Inability: Not on file  . Transportation needs:    Medical: Not on file    Non-medical: Not on file  Tobacco Use  . Smoking status: Never Smoker  . Smokeless tobacco: Never Used  Substance and Sexual Activity  . Alcohol use: No  . Drug use: No  . Sexual activity: Yes  Lifestyle  . Physical activity:    Days per week: Not on file    Minutes per session: Not on file  . Stress: Not on file    Relationships  . Social connections:    Talks on phone: Not on file    Gets together: Not on file    Attends religious service: Not on file    Active member of club or organization: Not on file    Attends meetings of clubs or organizations: Not on file    Relationship status: Not on file  Other Topics Concern  . Not on file  Social History Narrative  . Not on file    Past Medical History:  Diagnosis Date  . Acid reflux 12/02/2014  . Arthritis   . Headache   . Left thyroid nodule   . Muscle spasms of neck 12/02/2014     Patient Active Problem List   Diagnosis Date Noted  . Hypercholesterolemia 12/16/2016  . Dermatitis, eczematoid 12/02/2014  . GERD (gastroesophageal reflux disease) 12/02/2014  . 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 Pre-diabetes 12/02/2014  . Pain in shoulder 12/02/2014  . Thyroid nodule 12/02/2014  . Tinea corporis 12/02/2014  . 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 Allergic rhinitis 07/14/2009  . Disorder of eustachian tube 03/26/2009  . AD (atopic dermatitis) 03/26/2009  . Chronic infection of sinus 12/27/2007  . Insomnia 12/27/2007  . Disturbance of skin sensation 08/23/2006  . Multilevel degenerative disc disease 02/07/1998    Past Surgical History:  Procedure Laterality Date  . CHOLECYSTECTOMY  1999  . CORONARY ARTERY BYPASS  GRAFT  1999  . tosillectomy    . TUBAL LIGATION      Family History        Family Status  Relation Name Status  . Mother  Deceased at age 37       cause of death was Failure To Thrive  . Father  Deceased       Cause of Death was cancer-had a brain tumor, lung cancer and Emphysema  . Mat Aunt  (Not Specified)  . Sister  Alive  . Brother  Alive  . Daughter  Alive  . Son  Alive  . Sister  Alive  . Brother  Deceased at age 7       lymph  . Brother  Alive  . Brother  Deceased       work related accident  . Brother  Deceased       shot  . Brother  Alive        Her family history includes Breast cancer in her  maternal aunt; Cancer in her brother and brother; Diabetes in her mother, sister, and sister; Hypertension in her brother, brother, mother, sister, and sister; Leukemia in her brother.      No Known Allergies   Current Outpatient Medications:  .  fluticasone (FLONASE) 50 MCG/ACT nasal spray, Place 2 sprays into both nostrils daily., Disp: 16 g, Rfl: 6 .  gabapentin (NEURONTIN) 100 MG capsule, Take 1 capsule (100 mg total) by mouth 3 (three) times daily., Disp: 90 capsule, Rfl: 1 .  ibuprofen (ADVIL,MOTRIN) 200 MG tablet, Take by mouth., Disp: , Rfl:  .  loratadine (CLARITIN) 10 MG tablet, Take 10 mg by mouth daily., Disp: , Rfl:  .  cephALEXin (KEFLEX) 500 MG capsule, Take 1 capsule (500 mg total) by mouth 3 (three) times daily. (Patient not taking: Reported on 03/05/2018), Disp: 15 capsule, Rfl: 0 .  sulfamethoxazole-trimethoprim (BACTRIM DS,SEPTRA DS) 800-160 MG tablet, Take 1 tablet by mouth 2 (two) times daily. (Patient not taking: Reported on 03/05/2018), Disp: 14 tablet, Rfl: 0   Patient Care Team: Reine Just as PCP - General (Family Medicine)      Objective:   Vitals: BP 126/87 (BP Location: Left Arm, Patient Position: Sitting, Cuff Size: Large)   Pulse 83   Temp 98.1 F (36.7 C) (Oral)   Resp 16   Ht 5\' 2"  (1.575 m)   Wt 151 lb (68.5 kg)   SpO2 97%   BMI 27.62 kg/m    Vitals:   03/05/18 1012  BP: 126/87  Pulse: 83  Resp: 16  Temp: 98.1 F (36.7 C)  TempSrc: Oral  SpO2: 97%  Weight: 151 lb (68.5 kg)  Height: 5\' 2"  (1.575 m)     Physical Exam Vitals signs reviewed.  Constitutional:      General: She is not in acute distress.    Appearance: She is well-developed. She is not diaphoretic.  HENT:     Head: Normocephalic and atraumatic.     Right Ear: Hearing, tympanic membrane, ear canal and external ear normal.     Left Ear: Hearing, tympanic membrane, ear canal and external ear normal.     Nose: Nose normal.     Mouth/Throat:     Pharynx: Uvula  midline. No oropharyngeal exudate.  Eyes:     General: No scleral icterus.       Right eye: No discharge.        Left eye: No discharge.     Conjunctiva/sclera:  Conjunctivae normal.     Pupils: Pupils are equal, round, and reactive to light.  Neck:     Musculoskeletal: Normal range of motion and neck supple.     Thyroid: No thyromegaly.     Vascular: No carotid bruit or JVD.     Trachea: No tracheal deviation.  Cardiovascular:     Rate and Rhythm: Normal rate and regular rhythm.     Heart sounds: Normal heart sounds. No murmur. No friction rub. No gallop.   Pulmonary:     Effort: Pulmonary effort is normal. No respiratory distress.     Breath sounds: Normal breath sounds. No wheezing or rales.  Chest:     Chest wall: No tenderness.     Breasts: Breasts are symmetrical.        Right: No inverted nipple, mass, nipple discharge, skin change or tenderness.        Left: No inverted nipple, mass, nipple discharge, skin change or tenderness.  Abdominal:     General: Bowel sounds are normal. There is no distension.     Palpations: Abdomen is soft. There is no mass.     Tenderness: There is no abdominal tenderness. There is no guarding or rebound.     Hernia: There is no hernia in the right inguinal area or left inguinal area.  Genitourinary:    Exam position: Supine.     Labia:        Right: No rash, tenderness, lesion or injury.        Left: No rash, tenderness, lesion or injury.      Vagina: Normal. No signs of injury. No vaginal discharge, erythema, tenderness or bleeding.     Cervix: No cervical motion tenderness, discharge or friability.     Adnexa:        Right: No mass, tenderness or fullness.         Left: No mass, tenderness or fullness.       Rectum: Normal.     Comments: Grade 1 cystocele noted Musculoskeletal: Normal range of motion.        General: No tenderness.  Lymphadenopathy:     Cervical: No cervical adenopathy.  Skin:    General: Skin is warm and dry.      Findings: No rash.  Neurological:     Mental Status: She is alert and oriented to person, place, and time.     Cranial Nerves: No cranial nerve deficit.     Coordination: Coordination normal.     Deep Tendon Reflexes: Reflexes are normal and symmetric.  Psychiatric:        Behavior: Behavior normal.        Thought Content: Thought content normal.        Judgment: Judgment normal.      Depression Screen PHQ 2/9 Scores 03/05/2018 12/16/2016 12/14/2015  PHQ - 2 Score 0 0 0  PHQ- 9 Score 0 - -      Assessment & Plan:     Routine Health Maintenance and Physical Exam  Exercise Activities and Dietary recommendations Goals   None     Immunization History  Administered Date(s) Administered  . Tdap 09/05/2012    Health Maintenance  Topic Date Due  . MAMMOGRAM  01/12/2017  . PAP SMEAR-Modifier  12/03/2017  . INFLUENZA VACCINE  05/08/2018 (Originally 09/07/2017)  . COLONOSCOPY  07/12/2021  . TETANUS/TDAP  09/06/2022  . Hepatitis C Screening  Completed  . HIV Screening  Discontinued     Discussed health benefits  of physical activity, and encouraged her to engage in regular exercise appropriate for her age and condition.    1. Annual physical exam Normal physical exam today. Will check labs as below and f/u pending lab results. If labs are stable and WNL she will not need to have these rechecked for one year at her next annual physical exam. She is to call the office in the meantime if she has any acute issue, questions or concerns. - CBC w/Diff/Platelet - Comprehensive Metabolic Panel (CMET) - TSH - HgB A1c - Lipid Profile  2. Breast cancer screening Breast exam today was normal. There is no family history of breast cancer. She does perform regular self breast exams. Mammogram was ordered as below. [facility] given to patient so she may schedule her mammogram at her convenience. - Lipid Profile - MM 3D SCREEN BREAST BILATERAL; Future  3.  Cervical cancer screening Pap collected today. Will send as below and f/u pending results. - Cytology - PAP  4. Thyroid nodule Stable. Will check labs as below and f/u pending results. - TSH  5. Pre-diabetes Diet controlled. Will check labs as below and f/u pending results. - Comprehensive Metabolic Panel (CMET) - HgB A1c  6. Hypercholesterolemia Diet controlled. Will check labs as below and f/u pending results. - Comprehensive Metabolic Panel (CMET) - Lipid Profile  --------------------------------------------------------------------    Ashley LovelessJennifer M Latayna Ritchie, PA-C  [facility]

## 2018-03-05 ENCOUNTER — Encounter: Payer: Self-pay | Admitting: Physician Assistant

## 2018-03-05 ENCOUNTER — Other Ambulatory Visit (HOSPITAL_COMMUNITY)
Admission: RE | Admit: 2018-03-05 | Discharge: 2018-03-05 | Disposition: A | Discharge status: Home / Self Care | Payer: BLUE CROSS/BLUE SHIELD | Source: Ambulatory Visit | Attending: Physician Assistant | Admitting: Physician Assistant

## 2018-03-05 ENCOUNTER — Ambulatory Visit (INDEPENDENT_AMBULATORY_CARE_PROVIDER_SITE_OTHER): Payer: BLUE CROSS/BLUE SHIELD | Admitting: Physician Assistant

## 2018-03-05 VITALS — BP 126/87 | HR 83 | Temp 98.1°F | Resp 16 | Ht 62.0 in | Wt 151.0 lb

## 2018-03-05 DIAGNOSIS — Z0000 Encounter for general adult medical examination without abnormal findings: Secondary | ICD-10-CM

## 2018-03-05 DIAGNOSIS — Z124 Encounter for screening for malignant neoplasm of cervix: Secondary | ICD-10-CM

## 2018-03-05 DIAGNOSIS — R7303 Prediabetes: Secondary | ICD-10-CM

## 2018-03-05 DIAGNOSIS — Z1239 Encounter for other screening for malignant neoplasm of breast: Secondary | ICD-10-CM

## 2018-03-05 DIAGNOSIS — E7800 Pure hypercholesterolemia, unspecified: Secondary | ICD-10-CM

## 2018-03-05 DIAGNOSIS — E041 Nontoxic single thyroid nodule: Secondary | ICD-10-CM | POA: Diagnosis not present

## 2018-03-05 NOTE — Patient Instructions (Signed)
Health Maintenance for Postmenopausal Women Menopause is a normal process in which your reproductive ability comes to an end. This process happens gradually over a span of months to years, usually between the ages of 45 and 2. Menopause is complete when you have missed 12 consecutive menstrual periods. It is important to talk with your health care provider about some of the most common conditions that affect postmenopausal women, such as heart disease, cancer, and bone loss (osteoporosis). Adopting a healthy lifestyle and getting preventive care can help to promote your health and wellness. Those actions can also lower your chances of developing some of these common conditions. What should I know about menopause? During menopause, you may experience a number of symptoms, such as:  Moderate-to-severe hot flashes.  Night sweats.  Decrease in sex drive.  Mood swings.  Headaches.  Tiredness.  Irritability.  Memory problems.  Insomnia. Choosing to treat or not to treat menopausal changes is an individual decision that you make with your health care provider. What should I know about hormone replacement therapy and supplements? Hormone therapy products are effective for treating symptoms that are associated with menopause, such as hot flashes and night sweats. Hormone replacement carries certain risks, especially as you become older. If you are thinking about using estrogen or estrogen with progestin treatments, discuss the benefits and risks with your health care provider. What should I know about heart disease and stroke? Heart disease, heart attack, and stroke become more likely as you age. This may be due, in part, to the hormonal changes that your body experiences during menopause. These can affect how your body processes dietary fats, triglycerides, and cholesterol. Heart attack and stroke are both medical emergencies. There are many things that you can do to help prevent heart disease  and stroke:  Have your blood pressure checked at least every 1-2 years. High blood pressure causes heart disease and increases the risk of stroke.  If you are 4-9 years old, ask your health care provider if you should take aspirin to prevent a heart attack or a stroke.  Do not use any tobacco products, including cigarettes, chewing tobacco, or electronic cigarettes. If you need help quitting, ask your health care provider.  It is important to eat a healthy diet and maintain a healthy weight. ? Be sure to include plenty of vegetables, fruits, low-fat dairy products, and lean protein. ? Avoid eating foods that are high in solid fats, added sugars, or salt (sodium).  Get regular exercise. This is one of the most important things that you can do for your health. ? Try to exercise for at least 150 minutes each week. The type of exercise that you do should increase your heart rate and make you sweat. This is known as moderate-intensity exercise. ? Try to do strengthening exercises at least twice each week. Do these in addition to the moderate-intensity exercise.  Know your numbers.Ask your health care provider to check your cholesterol and your blood glucose. Continue to have your blood tested as directed by your health care provider.  What should I know about cancer screening? There are several types of cancer. Take the following steps to reduce your risk and to catch any cancer development as early as possible. Breast Cancer  Practice breast self-awareness. ? This means understanding how your breasts normally appear and feel. ? It also means doing regular breast self-exams. Let your health care provider know about any changes, no matter how small.  If you are 40 or  older, have a clinician do a breast exam (clinical breast exam or CBE) every year. Depending on your age, family history, and medical history, it may be recommended that you also have a yearly breast X-ray (mammogram).  If you  have a family history of breast cancer, talk with your health care provider about genetic screening.  If you are at high risk for breast cancer, talk with your health care provider about having an MRI and a mammogram every year.  Breast cancer (BRCA) gene test is recommended for women who have family members with BRCA-related cancers. Results of the assessment will determine the need for genetic counseling and BRCA1 and for BRCA2 testing. BRCA-related cancers include these types: ? Breast. This occurs in males or females. ? Ovarian. ? Tubal. This may also be called fallopian tube cancer. ? Cancer of the abdominal or pelvic lining (peritoneal cancer). ? Prostate. ? Pancreatic. Cervical, Uterine, and Ovarian Cancer Your health care provider may recommend that you be screened regularly for cancer of the pelvic organs. These include your ovaries, uterus, and vagina. This screening involves a pelvic exam, which includes checking for microscopic changes to the surface of your cervix (Pap test).  For women ages 21-65, health care providers may recommend a pelvic exam and a Pap test every three years. For women ages 39-65, they may recommend the Pap test and pelvic exam, combined with testing for human papilloma virus (HPV), every five years. Some types of HPV increase your risk of cervical cancer. Testing for HPV may also be done on women of any age who have unclear Pap test results.  Other health care providers may not recommend any screening for nonpregnant women who are considered low risk for pelvic cancer and have no symptoms. Ask your health care provider if a screening pelvic exam is right for you.  If you have had past treatment for cervical cancer or a condition that could lead to cancer, you need Pap tests and screening for cancer for at least 20 years after your treatment. If Pap tests have been discontinued for you, your risk factors (such as having a new sexual partner) need to be reassessed  to determine if you should start having screenings again. Some women have medical problems that increase the chance of getting cervical cancer. In these cases, your health care provider may recommend that you have screening and Pap tests more often.  If you have a family history of uterine cancer or ovarian cancer, talk with your health care provider about genetic screening.  If you have vaginal bleeding after reaching menopause, tell your health care provider.  There are currently no reliable tests available to screen for ovarian cancer. Lung Cancer Lung cancer screening is recommended for adults 57-50 years old who are at high risk for lung cancer because of a history of smoking. A yearly low-dose CT scan of the lungs is recommended if you:  Currently smoke.  Have a history of at least 30 pack-years of smoking and you currently smoke or have quit within the past 15 years. A pack-year is smoking an average of one pack of cigarettes per day for one year. Yearly screening should:  Continue until it has been 15 years since you quit.  Stop if you develop a health problem that would prevent you from having lung cancer treatment. Colorectal Cancer  This type of cancer can be detected and can often be prevented.  Routine colorectal cancer screening usually begins at age 12 and continues through  age 53.  If you have risk factors for colon cancer, your health care provider may recommend that you be screened at an earlier age.  If you have a family history of colorectal cancer, talk with your health care provider about genetic screening.  Your health care provider may also recommend using home test kits to check for hidden blood in your stool.  A small camera at the end of a tube can be used to examine your colon directly (sigmoidoscopy or colonoscopy). This is done to check for the earliest forms of colorectal cancer.  Direct examination of the colon should be repeated every 5-10 years until  age 77. However, if early forms of precancerous polyps or small growths are found or if you have a family history or genetic risk for colorectal cancer, you may need to be screened more often. Skin Cancer  Check your skin from head to toe regularly.  Monitor any moles. Be sure to tell your health care provider: ? About any new moles or changes in moles, especially if there is a change in a mole's shape or color. ? If you have a mole that is larger than the size of a pencil eraser.  If any of your family members has a history of skin cancer, especially at a young age, talk with your health care provider about genetic screening.  Always use sunscreen. Apply sunscreen liberally and repeatedly throughout the day.  Whenever you are outside, protect yourself by wearing long sleeves, pants, a wide-brimmed hat, and sunglasses. What should I know about osteoporosis? Osteoporosis is a condition in which bone destruction happens more quickly than new bone creation. After menopause, you may be at an increased risk for osteoporosis. To help prevent osteoporosis or the bone fractures that can happen because of osteoporosis, the following is recommended:  If you are 65-74 years old, get at least 1,000 mg of calcium and at least 600 mg of vitamin D per day.  If you are older than age 41 but younger than age 64, get at least 1,200 mg of calcium and at least 600 mg of vitamin D per day.  If you are older than age 65, get at least 1,200 mg of calcium and at least 800 mg of vitamin D per day. Smoking and excessive alcohol intake increase the risk of osteoporosis. Eat foods that are rich in calcium and vitamin D, and do weight-bearing exercises several times each week as directed by your health care provider. What should I know about how menopause affects my mental health? Depression may occur at any age, but it is more common as you become older. Common symptoms of depression include:  Low or sad  mood.  Changes in sleep patterns.  Changes in appetite or eating patterns.  Feeling an overall lack of motivation or enjoyment of activities that you previously enjoyed.  Frequent crying spells. Talk with your health care provider if you think that you are experiencing depression. What should I know about immunizations? It is important that you get and maintain your immunizations. These include:  Tetanus, diphtheria, and pertussis (Tdap) booster vaccine.  Influenza every year before the flu season begins.  Pneumonia vaccine.  Shingles vaccine. Your health care provider may also recommend other immunizations. This information is not intended to replace advice given to you by your health care provider. Make sure you discuss any questions you have with your health care provider. Document Released: 03/18/2005 Document Revised: 08/14/2015 Document Reviewed: 10/28/2014 Elsevier Interactive Patient Education  2019 [place].

## 2018-03-06 ENCOUNTER — Other Ambulatory Visit: Payer: Self-pay | Admitting: Physician Assistant

## 2018-03-06 DIAGNOSIS — G629 Polyneuropathy, unspecified: Secondary | ICD-10-CM

## 2018-03-06 DIAGNOSIS — M539 Dorsopathy, unspecified: Secondary | ICD-10-CM

## 2018-03-06 LAB — CBC WITH DIFFERENTIAL/PLATELET
Basophils Absolute: 0.1 10*3/uL (ref 0.0–0.2)
Basos: 1 %
EOS (ABSOLUTE): 0.4 10*3/uL (ref 0.0–0.4)
Eos: 5 %
Hematocrit: 42.6 % (ref 34.0–46.6)
Hemoglobin: 14.5 g/dL (ref 11.1–15.9)
Immature Grans (Abs): 0 10*3/uL (ref 0.0–0.1)
Immature Granulocytes: 0 %
Lymphocytes Absolute: 2 10*3/uL (ref 0.7–3.1)
Lymphs: 28 %
MCH: 29.4 pg (ref 26.6–33.0)
MCHC: 34 g/dL (ref 31.5–35.7)
MCV: 86 fL (ref 79–97)
Monocytes Absolute: 0.5 10*3/uL (ref 0.1–0.9)
Monocytes: 7 %
Neutrophils Absolute: 4.1 10*3/uL (ref 1.4–7.0)
Neutrophils: 59 %
Platelets: 353 10*3/uL (ref 150–450)
RBC: 4.93 x10E6/uL (ref 3.77–5.28)
RDW: 12.3 % (ref 11.7–15.4)
WBC: 7 10*3/uL (ref 3.4–10.8)

## 2018-03-06 LAB — LIPID PANEL
Chol/HDL Ratio: 3.2 ratio (ref 0.0–4.4)
Cholesterol, Total: 201 mg/dL — ABNORMAL HIGH (ref 100–199)
HDL: 63 mg/dL (ref >39)
LDL Calculated: 116 mg/dL — ABNORMAL HIGH (ref 0–99)
Triglycerides: 112 mg/dL (ref 0–149)
VLDL Cholesterol Cal: 22 mg/dL (ref 5–40)

## 2018-03-06 LAB — COMPREHENSIVE METABOLIC PANEL
ALT: 15 IU/L (ref 0–32)
AST: 21 IU/L (ref 0–40)
Albumin/Globulin Ratio: 2.2 (ref 1.2–2.2)
Albumin: 4.6 g/dL (ref 3.8–4.9)
Alkaline Phosphatase: 102 IU/L (ref 39–117)
BILIRUBIN TOTAL: 0.5 mg/dL (ref 0.0–1.2)
BUN/Creatinine Ratio: 15 (ref 12–28)
BUN: 12 mg/dL (ref 8–27)
CHLORIDE: 104 mmol/L (ref 96–106)
CO2: 24 mmol/L (ref 20–29)
Calcium: 9.5 mg/dL (ref 8.7–10.3)
Creatinine, Ser: 0.78 mg/dL (ref 0.57–1.00)
GFR calc Af Amer: 96 mL/min/{1.73_m2} (ref >59)
GFR calc non Af Amer: 83 mL/min/{1.73_m2} (ref >59)
Globulin, Total: 2.1 g/dL (ref 1.5–4.5)
Glucose: 99 mg/dL (ref 65–99)
Potassium: 4.3 mmol/L (ref 3.5–5.2)
Sodium: 142 mmol/L (ref 134–144)
Total Protein: 6.7 g/dL (ref 6.0–8.5)

## 2018-03-06 LAB — TSH: TSH: 0.675 u[IU]/mL (ref 0.450–4.500)

## 2018-03-06 LAB — HEMOGLOBIN A1C
Est. average glucose Bld gHb Est-mCnc: 114 mg/dL
Hgb A1c MFr Bld: 5.6 % (ref 4.8–5.6)

## 2018-03-06 MED ORDER — GABAPENTIN 100 MG PO CAPS: 100.0000 mg | ORAL_CAPSULE | Freq: Three times a day (TID) | ORAL | 5 refills | Status: DC

## 2018-03-06 NOTE — Telephone Encounter (Signed)
Pt needing a refill on her:  gabapentin (NEURONTIN) 100 MG capsule  Please fill at:  [facility] - [place], [place] - [address] [phone] (Phone) [phone] (Fax)   Thanks, [employer]

## 2018-03-07 LAB — CYTOLOGY - PAP
Diagnosis: NEGATIVE
HPV: NOT DETECTED

## 2018-03-08 ENCOUNTER — Telehealth: Payer: Self-pay

## 2018-03-08 NOTE — Telephone Encounter (Signed)
LMTCB

## 2018-03-08 NOTE — Telephone Encounter (Signed)
-----   Message from Margaretann LovelessJennifer M Burnette, [place]PA-C sent at 03/07/2018  6:00 PM EST ----- Pap is normal, HPV negative.  Will repeat in 3-5 years.

## 2018-03-12 NOTE — Telephone Encounter (Signed)
LMTCB

## 2018-03-16 NOTE — Telephone Encounter (Signed)
Patient advised as directed below. 

## 2018-03-19 ENCOUNTER — Telehealth: Payer: Self-pay

## 2018-03-19 ENCOUNTER — Ambulatory Visit
Admission: RE | Admit: 2018-03-19 | Discharge: 2018-03-19 | Disposition: A | Discharge status: Home / Self Care | Payer: BLUE CROSS/BLUE SHIELD | Source: Ambulatory Visit | Attending: Physician Assistant | Admitting: Physician Assistant

## 2018-03-19 DIAGNOSIS — Z1239 Encounter for other screening for malignant neoplasm of breast: Secondary | ICD-10-CM | POA: Insufficient documentation

## 2018-03-19 NOTE — Telephone Encounter (Signed)
-----   Message from Margaretann LovelessJennifer M Burnette, [place] sent at 03/19/2018  5:19 PM EST ----- Normal mammogram. Repeat screening in one year.

## 2018-03-19 NOTE — Telephone Encounter (Signed)
No answer

## 2018-03-21 NOTE — Telephone Encounter (Signed)
Patient advised as directed below. 

## 2018-03-21 NOTE — Telephone Encounter (Signed)
Pt returned missed call.  Please call pt back. ° °Thanks, °[facility] °

## 2018-04-03 DIAGNOSIS — G5603 Carpal tunnel syndrome, bilateral upper limbs: Secondary | ICD-10-CM | POA: Insufficient documentation

## 2018-12-05 ENCOUNTER — Other Ambulatory Visit: Payer: Self-pay

## 2018-12-05 DIAGNOSIS — Z20822 Contact with and (suspected) exposure to covid-19: Secondary | ICD-10-CM

## 2018-12-06 LAB — NOVEL CORONAVIRUS, NAA: SARS-CoV-2, NAA: NOT DETECTED

## 2019-01-02 ENCOUNTER — Other Ambulatory Visit: Payer: Self-pay | Admitting: Physician Assistant

## 2019-01-02 DIAGNOSIS — G629 Polyneuropathy, unspecified: Secondary | ICD-10-CM

## 2019-01-02 DIAGNOSIS — M539 Dorsopathy, unspecified: Secondary | ICD-10-CM

## 2019-05-14 ENCOUNTER — Telehealth: Payer: Self-pay

## 2019-05-14 NOTE — Telephone Encounter (Signed)
Copied from CRM [id number]. Topic: General - Inquiry >> May 14, 2019  3:11 PM Lynne Logan D wrote: Reason for CRM: Pt stated that her insurance company told her [facility] is no longer in network and her CPE would not be covered. Pt would like to know what she can do and when [facility] left the network. Please advise. Stated insurance told her she can see a MD in the office but not a PA.

## 2019-05-15 ENCOUNTER — Telehealth: Payer: Self-pay

## 2019-05-15 NOTE — Telephone Encounter (Signed)
Copied from CRM [id number]. Topic: General - Inquiry >> May 14, 2019  3:11 PM Lynne Logan D wrote: Reason for CRM: Pt stated that her insurance company told her [facility] is no longer in network and her CPE would not be covered. Pt would like to know what she can do and when [place] left the network. Please advise. Stated insurance told her she can see a MD in the office but not a PA. >> May 15, 2019  8:31 AM Wyonia Hough E wrote: Pt called to find out about her having a CPE with Jennifer/ pt has [employer] and [facility] is on the list but the insurance company advised the Pt that her CPE needs to be done by an MD to be covered/ please advise/ Pt has appt coming up

## 2019-05-15 NOTE — Telephone Encounter (Signed)
Duplicate note

## 2019-05-15 NOTE — Telephone Encounter (Signed)
I am not aware of this and have done CPE with patient's with [employer].   Dr. Leonard Schwartz or Maralyn Sago have you guys heard anything about this?   If it is true, I guess she could see Dr. B for her CPE

## 2019-05-20 ENCOUNTER — Encounter: Payer: Self-pay | Admitting: Physician Assistant

## 2019-05-20 ENCOUNTER — Ambulatory Visit (INDEPENDENT_AMBULATORY_CARE_PROVIDER_SITE_OTHER): Payer: 59 | Admitting: Physician Assistant

## 2019-05-20 ENCOUNTER — Telehealth: Payer: Self-pay | Admitting: Physician Assistant

## 2019-05-20 ENCOUNTER — Other Ambulatory Visit: Payer: Self-pay

## 2019-05-20 VITALS — BP 125/85 | HR 96 | Temp 97.3°F | Resp 16 | Ht 62.0 in | Wt 154.8 lb

## 2019-05-20 DIAGNOSIS — L0292 Furuncle, unspecified: Secondary | ICD-10-CM | POA: Diagnosis not present

## 2019-05-20 DIAGNOSIS — Z0000 Encounter for general adult medical examination without abnormal findings: Secondary | ICD-10-CM | POA: Diagnosis not present

## 2019-05-20 DIAGNOSIS — Z1239 Encounter for other screening for malignant neoplasm of breast: Secondary | ICD-10-CM | POA: Diagnosis not present

## 2019-05-20 DIAGNOSIS — F5101 Primary insomnia: Secondary | ICD-10-CM

## 2019-05-20 DIAGNOSIS — E041 Nontoxic single thyroid nodule: Secondary | ICD-10-CM

## 2019-05-20 DIAGNOSIS — R7303 Prediabetes: Secondary | ICD-10-CM | POA: Diagnosis not present

## 2019-05-20 DIAGNOSIS — E7800 Pure hypercholesterolemia, unspecified: Secondary | ICD-10-CM

## 2019-05-20 MED ORDER — RAMELTEON 8 MG PO TABS: 8.0000 mg | ORAL_TABLET | Freq: Every day | ORAL | 1 refills | Status: DC

## 2019-05-20 MED ORDER — DOXYCYCLINE HYCLATE 100 MG PO TABS: 100.0000 mg | ORAL_TABLET | Freq: Two times a day (BID) | ORAL | 0 refills | Status: DC

## 2019-05-20 NOTE — Progress Notes (Signed)
Complete physical exam      Patient: Ashley Wallace   DOB: [birth year]   64 y.o. Female  MRN: [id number] Visit Date: 05/20/2019  Today's healthcare provider: [name], PA-C  Subjective:    Chief Complaint  Patient presents with  . Annual Exam    Ashley Wallace is a 64 y.o. female who presents today for a complete physical exam. HPI  03/07/18==Pap is normal, HPV negative. Will repeat in 3-5 years. 03/19/18==Normal mammogram. Repeat screening in one year 07/13/11==Colonoscopy  Past Medical History:  Diagnosis Date  . Acid reflux 12/02/2014  . Arthritis   . Headache   . Left thyroid nodule   . Muscle spasms of neck 12/02/2014   Past Surgical History:  Procedure Laterality Date  . CHOLECYSTECTOMY  1999  . CORONARY ARTERY BYPASS GRAFT  1999  . tosillectomy    . TUBAL LIGATION     Social History   Socioeconomic History  . Marital status: Widowed    Spouse name: Not on file  . Number of children: Not on file  . Years of education: Not on file  . Highest education level: Not on file  Occupational History  . Not on file  Tobacco Use  . Smoking status: Never Smoker  . Smokeless tobacco: Never Used  Substance and Sexual Activity  . Alcohol use: No  . Drug use: No  . Sexual activity: Yes  Other Topics Concern  . Not on file  Social History Narrative  . Not on file   Social Determinants of Health   Financial Resource Strain:   . Difficulty of Paying Living Expenses:   Food Insecurity:   . Worried About Programme researcher, broadcasting/film/video in the Last Year:   . Barista in the Last Year:   Transportation Needs:   . Freight forwarder (Medical):   Marland Kitchen Lack of Transportation (Non-Medical):   Physical Activity:   . Days of Exercise per Week:   . Minutes of Exercise per Session:   Stress:   . Feeling of Stress :   Social Connections:   . Frequency of Communication with Friends and Family:   . Frequency of Social Gatherings with Friends and  Family:   . Attends Religious Services:   . Active Member of Clubs or Organizations:   . Attends Banker Meetings:   Marland Kitchen Marital Status:   Intimate Partner Violence:   . Fear of Current or Ex-Partner:   . Emotionally Abused:   Marland Kitchen Physically Abused:   . Sexually Abused:    Family Status  Relation Name Status  . Mother  Deceased at age 59       cause of death was Failure To Thrive  . Father  Deceased       Cause of Death was cancer-had a brain tumor, lung cancer and Emphysema  . Mat Aunt  (Not Specified)  . Sister  Alive  . Brother  Alive  . Daughter  Alive  . Son  Alive  . Sister  Alive  . Brother  Deceased at age 72       lymph  . Brother  Alive  . Brother  Deceased       work related accident  . Brother  Deceased       shot  . Brother  Alive   Family History  Problem Relation Age of Onset  . Hypertension Mother   . Diabetes Mother  non-Insulin dependent  . Breast cancer Maternal Aunt   . Diabetes Sister   . Hypertension Sister   . Hypertension Brother   . Diabetes Sister   . Hypertension Sister   . Cancer Brother   . Leukemia Brother   . Cancer Brother        kidney  . Hypertension Brother    Allergies  Allergen Reactions  . Sulfamethoxazole-Trimethoprim     Patient Care Team: [name], PA-C as PCP - General (Family Medicine)   Medications: Outpatient Medications Prior to Visit  Medication Sig  . fluticasone (FLONASE) 50 MCG/ACT nasal spray Place 2 sprays into both nostrils daily.  Marland Kitchen gabapentin (NEURONTIN) 100 MG capsule TAKE 1 CAPSULE 3 TIMES DAILY (Patient taking differently: No sig reported)  . ibuprofen (ADVIL,MOTRIN) 200 MG tablet Take by mouth.  . loratadine (CLARITIN) 10 MG tablet Take 10 mg by mouth daily.  . Multiple Vitamin (MULTIVITAMIN) tablet Take 1 tablet by mouth daily.  . meloxicam (MOBIC) 15 MG tablet Take 15 mg by mouth daily.   No facility-administered medications prior to visit.    Review of Systems    Constitutional: Positive for appetite change and fatigue.  HENT: Negative.   Eyes: Negative.   Respiratory: Negative.   Cardiovascular: Negative.   Gastrointestinal: Positive for constipation.  Endocrine: Negative.   Genitourinary: Positive for genital sores ("new, noticed "yesterday).  Musculoskeletal: Positive for arthralgias, back pain, myalgias and neck pain.  Skin: Negative.   Allergic/Immunologic: Positive for environmental allergies.  Neurological: Negative.   Hematological: Negative.   Psychiatric/Behavioral: Positive for sleep disturbance.         Objective:    BP 125/85 (BP Location: Left Arm, Patient Position: Sitting, Cuff Size: Large)   Pulse 96   Temp (!) 97.3 F (36.3 C) (Temporal)   Resp 16   Ht 5\' 2"  (1.575 m)   Wt 154 lb 12.8 oz (70.2 kg)   BMI 28.31 kg/m  BP Readings from Last 3 Encounters:  05/20/19 125/85  03/05/18 126/87  02/19/18 110/80   Wt Readings from Last 3 Encounters:  05/20/19 154 lb 12.8 oz (70.2 kg)  03/05/18 151 lb (68.5 kg)  02/18/18 151 lb (68.5 kg)      Physical Exam Vitals reviewed.  Constitutional:      General: She is not in acute distress.    Appearance: Normal appearance. She is well-developed and overweight. She is not diaphoretic.  HENT:     Head: Normocephalic and atraumatic.     Right Ear: Hearing, tympanic membrane, ear canal and external ear normal.     Left Ear: Hearing, tympanic membrane, ear canal and external ear normal.     Nose: Nose normal.     Mouth/Throat:     Mouth: Mucous membranes are moist.     Pharynx: Oropharynx is clear. Uvula midline. No oropharyngeal exudate or posterior oropharyngeal erythema.  Eyes:     General: No scleral icterus.       Right eye: No discharge.        Left eye: No discharge.     Extraocular Movements: Extraocular movements intact.     Conjunctiva/sclera: Conjunctivae normal.     Pupils: Pupils are equal, round, and reactive to light.  Neck:     Thyroid: No thyromegaly.      Vascular: No carotid bruit or JVD.     Trachea: No tracheal deviation.  Cardiovascular:     Rate and Rhythm: Normal rate and regular rhythm.  Pulses: Normal pulses.     Heart sounds: Normal heart sounds. No murmur. No friction rub. No gallop.   Pulmonary:     Effort: Pulmonary effort is normal. No respiratory distress.     Breath sounds: Normal breath sounds. No wheezing or rales.  Chest:     Chest wall: No tenderness.     Breasts: Breasts are symmetrical.        Right: No inverted nipple, mass, nipple discharge, skin change or tenderness.        Left: No inverted nipple, mass, nipple discharge, skin change or tenderness.  Abdominal:     General: Bowel sounds are normal. There is no distension.     Palpations: Abdomen is soft. There is no mass.     Tenderness: There is no abdominal tenderness. There is no guarding or rebound.     Hernia: There is no hernia in the left inguinal area.  Genitourinary:    General: Normal vulva.     Exam position: Supine.     Labia:        Right: No rash, tenderness, lesion or injury.        Left: Lesion present. No rash, tenderness or injury.      Vagina: Normal. No signs of injury. No vaginal discharge, erythema, tenderness or bleeding.     Cervix: No cervical motion tenderness, discharge or friability.     Adnexa:        Right: No mass, tenderness or fullness.         Left: No mass, tenderness or fullness.       Rectum: Normal.    Musculoskeletal:        General: No tenderness. Normal range of motion.     Cervical back: Normal range of motion and neck supple.     Right lower leg: No edema.     Left lower leg: No edema.  Lymphadenopathy:     Cervical: No cervical adenopathy.  Skin:    General: Skin is warm and dry.     Capillary Refill: Capillary refill takes less than 2 seconds.     Findings: No rash.  Neurological:     General: No focal deficit present.     Mental Status: She is alert and oriented to person, place, and time.  Mental status is at baseline.     Cranial Nerves: No cranial nerve deficit.     Coordination: Coordination normal.     Deep Tendon Reflexes: Reflexes are normal and symmetric.  Psychiatric:        Mood and Affect: Mood normal.        Behavior: Behavior normal. Behavior is cooperative.        Thought Content: Thought content normal.        Judgment: Judgment normal.       Depression Screen  PHQ 2/9 Scores 05/20/2019 03/05/2018 12/16/2016  PHQ - 2 Score 0 0 0  PHQ- 9 Score - 0 -    No results found for any visits on 05/20/19.    Assessment & Plan:    Routine Health Maintenance and Physical Exam  Exercise Activities and Dietary recommendations Goals   None     Immunization History  Administered Date(s) Administered  . Tdap 09/05/2012    Health Maintenance  Topic Date Due  . INFLUENZA VACCINE  09/08/2019  . MAMMOGRAM  03/19/2020  . PAP SMEAR-Modifier  03/05/2021  . COLONOSCOPY  07/12/2021  . TETANUS/TDAP  09/06/2022  . Hepatitis C Screening  Completed  . HIV Screening  Discontinued    Discussed health benefits of physical activity, and encouraged her to engage in regular exercise appropriate for her age and condition.  1. Annual physical exam Normal physical exam today. Will check labs as below and f/u pending lab results. If labs are stable and WNL she will not need to have these rechecked for one year at her next annual physical exam. She is to call the office in the meantime if she has any acute issue, questions or concerns.  2. Encounter for breast cancer screening using non-mammogram modality Breast exam today was normal. There is no family history of breast cancer. She does perform regular self breast exams. Mammogram was ordered as below. Information for [facility] clinic was given to patient so she may schedule her mammogram at her convenience. - MM 3D SCREEN BREAST BILATERAL; Future  3. Thyroid nodule Will check labs as below and f/u pending results. -  TSH  4. Pre-diabetes Diet controlled. Will check labs as below and f/u pending results. - CBC with Differential/Platelet - Comprehensive metabolic panel - Hemoglobin A1c  5. Hypercholesterolemia Diet controlled. Will check labs as below and f/u pending results. - CBC with Differential/Platelet - Comprehensive metabolic panel - Lipid panel  6. Primary insomnia Worsening. Patient has tried trazodone and zolpidem CR 6.25mg  without relief. Will see if Ramelteon can be approved. Call if sleep not improving.  - ramelteon (ROZEREM) 8 MG tablet; Take 1 tablet (8 mg total) by mouth at bedtime.  Dispense: 90 tablet; Refill: 1  7. Boil Most likely infected sebaceous cyst on left labia majora. Lesion was I&D in office, see procedure note below. Tolerated well. Doxycycline for a few days to cover infection. Continue warm compresses.  - doxycycline (VIBRA-TABS) 100 MG tablet; Take 1 tablet (100 mg total) by mouth 2 (two) times daily.  Dispense: 10 tablet; Refill: 0  Procedure Note: Procedure, benefits and risks (including bleeding, infection and allergic reaction) and alternatives were explained to the patient. All questions were sought and answered. Verbal consent was obtained.  The area was then prepped with betadine swabs and draped in a sterile fashion. Local anesthetic was administered with 1.0cc of 2% xylocaine with epinephrine. An incision was then made using a size 10 scalpel blade. The area was then drained until no more purulent material could be expelled. The area was then cleaned and covered with a dry dressing. There were no complications and patient tolerated well. Wound cleansing instructions and signs of infection were verbally given to the patient.      Rubye Beach  [facility] [phone] (phone) [phone] (fax)  [place]

## 2019-05-20 NOTE — Telephone Encounter (Signed)
We are doing PA for rozerem for coverage and antibiotic resent

## 2019-05-20 NOTE — Telephone Encounter (Signed)
Pt states she went to pharmacy to pick up ramelteon (ROZEREM) 8 MG tablet and it was going to be over $300.  Pt wants to know whatever she can have prescribed to her.

## 2019-05-20 NOTE — Telephone Encounter (Signed)
Did we figure this out?  Happy to help if needed.  I see the CPE is scheduled for this afternoon with [facility].

## 2019-05-20 NOTE — Patient Instructions (Signed)
[facility] [address] [place],  [place]  [address] Get Driving Directions Main: [phone]   Health Maintenance for Postmenopausal Women Menopause is a normal process in which your ability to get pregnant comes to an end. This process happens slowly over many months or years, usually between the ages of 48 and 55. Menopause is complete when you have missed your menstrual periods for 12 months. It is important to talk with your health care provider about some of the most common conditions that affect women after menopause (postmenopausal women). These include heart disease, cancer, and bone loss (osteoporosis). Adopting a healthy lifestyle and getting preventive care can help to promote your health and wellness. The actions you take can also lower your chances of developing some of these common conditions. What should I know about menopause? During menopause, you may get a number of symptoms, such as:  Hot flashes. These can be moderate or severe.  Night sweats.  Decrease in sex drive.  Mood swings.  Headaches.  Tiredness.  Irritability.  Memory problems.  Insomnia. Choosing to treat or not to treat these symptoms is a decision that you make with your health care provider. Do I need hormone replacement therapy?  Hormone replacement therapy is effective in treating symptoms that are caused by menopause, such as hot flashes and night sweats.  Hormone replacement carries certain risks, especially as you become older. If you are thinking about using estrogen or estrogen with progestin, discuss the benefits and risks with your health care provider. What is my risk for heart disease and stroke? The risk of heart disease, heart attack, and stroke increases as you age. One of the causes may be a change in the body's hormones during menopause. This can affect how your body uses dietary fats, triglycerides, and cholesterol. Heart attack and stroke  are medical emergencies. There are many things that you can do to help prevent heart disease and stroke. Watch your blood pressure  High blood pressure causes heart disease and increases the risk of stroke. This is more likely to develop in people who have high blood pressure readings, are of African descent, or are overweight.  Have your blood pressure checked: ? Every 3-5 years if you are 18-39 years of age. ? Every year if you are 40 years old or older. Eat a healthy diet   Eat a diet that includes plenty of vegetables, fruits, low-fat dairy products, and lean protein.  Do not eat a lot of foods that are high in solid fats, added sugars, or sodium. Get regular exercise Get regular exercise. This is one of the most important things you can do for your health. Most adults should:  Try to exercise for at least 150 minutes each week. The exercise should increase your heart rate and make you sweat (moderate-intensity exercise).  Try to do strengthening exercises at least twice each week. Do these in addition to the moderate-intensity exercise.  Spend less time sitting. Even light physical activity can be beneficial. Other tips  Work with your health care provider to achieve or maintain a healthy weight.  Do not use any products that contain nicotine or tobacco, such as cigarettes, e-cigarettes, and chewing tobacco. If you need help quitting, ask your health care provider.  Know your numbers. Ask your health care provider to check your cholesterol and your blood sugar (glucose). Continue to have your blood tested as directed by your health care provider. Do I need screening for   cancer? Depending on your health history and family history, you may need to have cancer screening at different stages of your life. This may include screening for:  Breast cancer.  Cervical cancer.  Lung cancer.  Colorectal cancer. What is my risk for osteoporosis? After menopause, you may be at increased  risk for osteoporosis. Osteoporosis is a condition in which bone destruction happens more quickly than new bone creation. To help prevent osteoporosis or the bone fractures that can happen because of osteoporosis, you may take the following actions:  If you are 19-50 years old, get at least 1,000 mg of calcium and at least 600 mg of vitamin D per day.  If you are older than age 50 but younger than age 70, get at least 1,200 mg of calcium and at least 600 mg of vitamin D per day.  If you are older than age 70, get at least 1,200 mg of calcium and at least 800 mg of vitamin D per day. Smoking and drinking excessive alcohol increase the risk of osteoporosis. Eat foods that are rich in calcium and vitamin D, and do weight-bearing exercises several times each week as directed by your health care provider. How does menopause affect my mental health? Depression may occur at any age, but it is more common as you become older. Common symptoms of depression include:  Low or sad mood.  Changes in sleep patterns.  Changes in appetite or eating patterns.  Feeling an overall lack of motivation or enjoyment of activities that you previously enjoyed.  Frequent crying spells. Talk with your health care provider if you think that you are experiencing depression. General instructions See your health care provider for regular wellness exams and vaccines. This may include:  Scheduling regular health, dental, and eye exams.  Getting and maintaining your vaccines. These include: ? Influenza vaccine. Get this vaccine each year before the flu season begins. ? Pneumonia vaccine. ? Shingles vaccine. ? Tetanus, diphtheria, and pertussis (Tdap) booster vaccine. Your health care provider may also recommend other immunizations. Tell your health care provider if you have ever been abused or do not feel safe at home. Summary  Menopause is a normal process in which your ability to get pregnant comes to an  end.  This condition causes hot flashes, night sweats, decreased interest in sex, mood swings, headaches, or lack of sleep.  Treatment for this condition may include hormone replacement therapy.  Take actions to keep yourself healthy, including exercising regularly, eating a healthy diet, watching your weight, and checking your blood pressure and blood sugar levels.  Get screened for cancer and depression. Make sure that you are up to date with all your vaccines. This information is not intended to replace advice given to you by your health care provider. Make sure you discuss any questions you have with your health care provider. Document Revised: 01/17/2018 Document Reviewed: 01/17/2018 Elsevier Patient Education  2020 Elsevier Inc.  

## 2019-05-20 NOTE — Telephone Encounter (Signed)
Patient is calling back to let Ashley Wallace know that the antibotics were not calling in to [facility]. Can that be sent in today for her please? Please advise

## 2019-05-21 ENCOUNTER — Telehealth: Payer: Self-pay

## 2019-05-21 LAB — CBC WITH DIFFERENTIAL/PLATELET
Basophils Absolute: 0.1 10*3/uL (ref 0.0–0.2)
Basos: 1 %
EOS (ABSOLUTE): 0.3 10*3/uL (ref 0.0–0.4)
Eos: 4 %
Hematocrit: 43.2 % (ref 34.0–46.6)
Hemoglobin: 14.6 g/dL (ref 11.1–15.9)
Immature Grans (Abs): 0 10*3/uL (ref 0.0–0.1)
Immature Granulocytes: 0 %
Lymphocytes Absolute: 1.9 10*3/uL (ref 0.7–3.1)
Lymphs: 26 %
MCH: 29.4 pg (ref 26.6–33.0)
MCHC: 33.8 g/dL (ref 31.5–35.7)
MCV: 87 fL (ref 79–97)
Monocytes Absolute: 0.6 10*3/uL (ref 0.1–0.9)
Monocytes: 8 %
Neutrophils Absolute: 4.6 10*3/uL (ref 1.4–7.0)
Neutrophils: 61 %
Platelets: 321 10*3/uL (ref 150–450)
RBC: 4.97 x10E6/uL (ref 3.77–5.28)
RDW: 12.7 % (ref 11.7–15.4)
WBC: 7.5 10*3/uL (ref 3.4–10.8)

## 2019-05-21 LAB — COMPREHENSIVE METABOLIC PANEL
ALT: 21 IU/L (ref 0–32)
AST: 23 IU/L (ref 0–40)
Albumin/Globulin Ratio: 2 (ref 1.2–2.2)
Albumin: 4.4 g/dL (ref 3.8–4.8)
Alkaline Phosphatase: 97 IU/L (ref 39–117)
BUN/Creatinine Ratio: 20 (ref 12–28)
BUN: 14 mg/dL (ref 8–27)
Bilirubin Total: 0.7 mg/dL (ref 0.0–1.2)
CO2: 23 mmol/L (ref 20–29)
Calcium: 9.7 mg/dL (ref 8.7–10.3)
Chloride: 103 mmol/L (ref 96–106)
Creatinine, Ser: 0.7 mg/dL (ref 0.57–1.00)
GFR calc Af Amer: 108 mL/min/{1.73_m2} (ref >59)
GFR calc non Af Amer: 94 mL/min/{1.73_m2} (ref >59)
Globulin, Total: 2.2 g/dL (ref 1.5–4.5)
Glucose: 91 mg/dL (ref 65–99)
Potassium: 4.3 mmol/L (ref 3.5–5.2)
Sodium: 141 mmol/L (ref 134–144)
Total Protein: 6.6 g/dL (ref 6.0–8.5)

## 2019-05-21 LAB — TSH: TSH: 0.53 u[IU]/mL (ref 0.450–4.500)

## 2019-05-21 LAB — LIPID PANEL
Chol/HDL Ratio: 4 ratio (ref 0.0–4.4)
Cholesterol, Total: 214 mg/dL — ABNORMAL HIGH (ref 100–199)
HDL: 53 mg/dL (ref >39)
LDL Chol Calc (NIH): 130 mg/dL — ABNORMAL HIGH (ref 0–99)
Triglycerides: 174 mg/dL — ABNORMAL HIGH (ref 0–149)
VLDL Cholesterol Cal: 31 mg/dL (ref 5–40)

## 2019-05-21 LAB — HEMOGLOBIN A1C
Est. average glucose Bld gHb Est-mCnc: 114 mg/dL
Hgb A1c MFr Bld: 5.6 % (ref 4.8–5.6)

## 2019-05-21 NOTE — Telephone Encounter (Signed)
LMTCB 05/21/2019.  [facility] please advise pt of lab results below.   Thanks,   -Kaedance Magos  

## 2019-05-21 NOTE — Telephone Encounter (Signed)
-----   Message from [name], [place] sent at 05/21/2019  3:45 PM EDT ----- Blood count is normal. Kidney and liver function are normal. Sodium, potassium and calcium are normal. Sugar/A1c is normal. Cholesterol has increased just slightly compared to last year. Currently your risk of having a cardiovascular event over the next 10 years remains low at 3.8%. Continue working on [employer] and exercise habits. Thyroid is normal.

## 2019-05-21 NOTE — Telephone Encounter (Signed)
Patient notified of lab results and PCP recommendation. 

## 2019-05-24 MED ORDER — ESZOPICLONE 3 MG PO TABS: 3.0000 mg | ORAL_TABLET | Freq: Every day | ORAL | 0 refills | Status: DC

## 2019-05-24 NOTE — Telephone Encounter (Signed)
PA also was denied.

## 2019-05-24 NOTE — Telephone Encounter (Signed)
Sent in Lunesta. 

## 2019-05-24 NOTE — Addendum Note (Signed)
Addended by: [name] on: 05/24/2019 01:55 PM   Modules accepted: Orders

## 2019-05-24 NOTE — Telephone Encounter (Addendum)
Patient states ramelteon (ROZEREM) 8 MG tablet expensive and would like PCP to send in a alternate  [facility] - [place], [place] [address] Phone:  [phone]  Fax:  [phone]

## 2019-06-17 ENCOUNTER — Other Ambulatory Visit: Payer: Self-pay | Admitting: Physician Assistant

## 2019-06-17 DIAGNOSIS — F5101 Primary insomnia: Secondary | ICD-10-CM

## 2019-06-17 MED ORDER — ESZOPICLONE 3 MG PO TABS: 3.0000 mg | ORAL_TABLET | Freq: Every day | ORAL | 1 refills | Status: DC

## 2019-06-17 NOTE — Telephone Encounter (Signed)
Pt is supposed to take  Eszopiclone 3 MG TABS For 3 months as she understood from dr, but no refills on her bottle.  Pt would like this called to   [facility] - [place], [place] - [id number] Phone:  [phone]  Fax:  [phone]

## 2019-06-17 NOTE — Telephone Encounter (Signed)
Requested medication (s) are due for refill today: yes  Requested medication (s) are on the active medication list: yes}  Last refill:  05/24/19  Future visit scheduled: yes  Notes to clinic:  not delegated   Requested Prescriptions  Pending Prescriptions Disp Refills   Eszopiclone 3 MG TABS 30 tablet 0    Sig: Take 1 tablet (3 mg total) by mouth at bedtime. Take immediately before bedtime      Not Delegated - Psychiatry:  Anxiolytics/Hypnotics Failed - 06/17/2019  8:37 AM      Failed - This refill cannot be delegated      Failed - Urine Drug Screen completed in last 360 days.      Failed - Valid encounter within last 6 months    Recent Outpatient Visits           4 weeks ago Annual physical exam   [facility] [place], Alessandra Bevels, [place]   1 year ago Annual physical exam   [facility] Joycelyn Man M, [place]   1 year ago Acute cystitis without hematuria   [facility] [place], [place], [place]   1 year ago Acute cystitis with hematuria   [facility] [place], Alessandra Bevels, [place]   1 year ago Multilevel degenerative disc disease   [facility] [place], Alessandra Bevels, PA-C       Future Appointments             In 2 months [place], Alessandra Bevels, PA-C [employer], [facility]

## 2019-06-18 ENCOUNTER — Ambulatory Visit
Admission: RE | Admit: 2019-06-18 | Discharge: 2019-06-18 | Disposition: A | Discharge status: Home / Self Care | Payer: 59 | Source: Ambulatory Visit | Attending: Physician Assistant | Admitting: Physician Assistant

## 2019-06-18 ENCOUNTER — Telehealth: Payer: Self-pay

## 2019-06-18 DIAGNOSIS — Z1231 Encounter for screening mammogram for malignant neoplasm of breast: Secondary | ICD-10-CM | POA: Diagnosis present

## 2019-06-18 DIAGNOSIS — Z1239 Encounter for other screening for malignant neoplasm of breast: Secondary | ICD-10-CM

## 2019-06-18 NOTE — Telephone Encounter (Signed)
LMTCB or to view results on my chart. If patient calls back ok for [facility] nurse to give results.  

## 2019-06-18 NOTE — Telephone Encounter (Signed)
-----   Message from [name], [place] sent at 06/18/2019  3:13 PM EDT ----- Normal mammogram. Repeat screening in one year.

## 2019-06-21 DIAGNOSIS — M542 Cervicalgia: Secondary | ICD-10-CM | POA: Insufficient documentation

## 2019-08-19 ENCOUNTER — Ambulatory Visit (INDEPENDENT_AMBULATORY_CARE_PROVIDER_SITE_OTHER): Payer: 59 | Admitting: Physician Assistant

## 2019-08-19 ENCOUNTER — Encounter: Payer: Self-pay | Admitting: Physician Assistant

## 2019-08-19 DIAGNOSIS — F5101 Primary insomnia: Secondary | ICD-10-CM | POA: Diagnosis not present

## 2019-08-19 DIAGNOSIS — K5904 Chronic idiopathic constipation: Secondary | ICD-10-CM

## 2019-08-19 MED ORDER — ESZOPICLONE 3 MG PO TABS: 3.0000 mg | ORAL_TABLET | Freq: Every day | ORAL | 1 refills | Status: DC

## 2019-08-19 NOTE — Progress Notes (Addendum)
Virtual telephone visit    Virtual Visit via Telephone Note   This visit type was conducted due to national recommendations for restrictions regarding the COVID-19 Pandemic (e.g. social distancing) in an effort to limit this patient's exposure and mitigate transmission in our community. Due to her co-morbid illnesses, this patient is at least at moderate risk for complications without adequate follow up. This format is felt to be most appropriate for this patient at this time. The patient did not have access to video technology or had technical difficulties with video requiring transitioning to audio format only (telephone). Physical exam was limited to content and character of the telephone converstion.    I connected with Ashley Wallace on 08/21/19 at  8:00 AM EDT by telephone and verified that I am speaking with the correct person using two identifiers.  I discussed the limitations, risks, security and privacy concerns of performing an evaluation and management service by telephone and the availability of in person appointments. I also discussed with the patient that there may be a patient responsible charge related to this service. The patient expressed understanding and agreed to proceed.   Patient location: Home Provider location: [facility]   Visit Date: 08/19/2019  Today's healthcare provider: [name], PA-C   No chief complaint on file.  Subjective    HPI  Follow up for Insomnia  The patient was last seen for this 3 months ago. Changes made at last visit include Ramelteon 8mg  but this was denied by her insurance, was sent in.  She reports excellent compliance with treatment. She feels that condition is Improved. She is not having side effects.    Still having issues with constipation. Reports when she takes stool softener or laxative she can have a BM, but does have to take something. Without taking anything she will not go regularly and have hard  BM. Has tried Linzess in the past.  -----------------------------------------------------------------------------------------    Patient Active Problem List   Diagnosis Date Noted  . Hypercholesterolemia 12/16/2016  . Dermatitis, eczematoid 12/02/2014  . GERD (gastroesophageal reflux disease) 12/02/2014  . 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 Pre-diabetes 12/02/2014  . Pain in shoulder 12/02/2014  . Thyroid nodule 12/02/2014  . Tinea corporis 12/02/2014  . 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 Allergic rhinitis 07/14/2009  . Disorder of eustachian tube 03/26/2009  . AD (atopic dermatitis) 03/26/2009  . Chronic infection of sinus 12/27/2007  . Insomnia 12/27/2007  . Disturbance of skin sensation 08/23/2006  . Multilevel degenerative disc disease 02/07/1998   Past Medical History:  Diagnosis Date  . Acid reflux 12/02/2014  . Arthritis   . Headache   . Left thyroid nodule   . Muscle spasms of neck 12/02/2014      Medications: Outpatient Medications Prior to Visit  Medication Sig  . doxycycline (VIBRA-TABS) 100 MG tablet Take 1 tablet (100 mg total) by mouth 2 (two) times daily.  . Eszopiclone 3 MG TABS Take 1 tablet (3 mg total) by mouth at bedtime. Take immediately before bedtime  . fluticasone (FLONASE) 50 MCG/ACT nasal spray Place 2 sprays into both nostrils daily.  12/04/2014 gabapentin (NEURONTIN) 100 MG capsule TAKE 1 CAPSULE 3 TIMES DAILY (Patient taking differently: No sig reported)  . ibuprofen (ADVIL,MOTRIN) 200 MG tablet Take by mouth.  . loratadine (CLARITIN) 10 MG tablet Take 10 mg by mouth daily.  . meloxicam (MOBIC) 15 MG tablet Take 15 mg by mouth daily.  . Multiple Vitamin (MULTIVITAMIN) tablet Take 1 tablet  by mouth daily.   No facility-administered medications prior to visit.    Review of Systems  Constitutional: Negative.   Respiratory: Negative.   Cardiovascular: Negative.   Gastrointestinal: Positive for constipation. Negative for abdominal pain, anal bleeding,  diarrhea, nausea and rectal pain.  Neurological: Negative.     Last CBC Lab Results  Component Value Date   WBC 7.5 05/20/2019   HGB 14.6 05/20/2019   HCT 43.2 05/20/2019   MCV 87 05/20/2019   MCH 29.4 05/20/2019   RDW 12.7 05/20/2019   PLT 321 05/20/2019   Last metabolic panel Lab Results  Component Value Date   GLUCOSE 91 05/20/2019   NA 141 05/20/2019   K 4.3 05/20/2019   CL 103 05/20/2019   CO2 23 05/20/2019   BUN 14 05/20/2019   CREATININE 0.70 05/20/2019   GFRNONAA 94 05/20/2019   GFRAA 108 05/20/2019   CALCIUM 9.7 05/20/2019   PROT 6.6 05/20/2019   ALBUMIN 4.4 05/20/2019   LABGLOB 2.2 05/20/2019   AGRATIO 2.0 05/20/2019   BILITOT 0.7 05/20/2019   ALKPHOS 97 05/20/2019   AST 23 05/20/2019   ALT 21 05/20/2019   ANIONGAP 8 02/18/2018   Last lipids Lab Results  Component Value Date   CHOL 214 (H) 05/20/2019   HDL 53 05/20/2019   LDLCALC 130 (H) 05/20/2019   TRIG 174 (H) 05/20/2019   CHOLHDL 4.0 05/20/2019      Objective    There were no vitals taken for this visit. BP Readings from Last 3 Encounters:  05/20/19 125/85  03/05/18 126/87  02/19/18 110/80   Wt Readings from Last 3 Encounters:  05/20/19 154 lb 12.8 oz (70.2 kg)  03/05/18 151 lb (68.5 kg)  02/18/18 151 lb (68.5 kg)        Assessment & Plan     1. Primary insomnia Improved. Continue Lunesta 3mg  as below.  - Eszopiclone 3 MG TABS; Take 1 tablet (3 mg total) by mouth at bedtime. Take immediately before bedtime  Dispense: 90 tablet; Refill: 1  2. Chronic idiopathic constipation Discussed treatment options. She is going to continue Miralax daily at this time. Increase dietary fibers and water intake. May consider GI referral if not improving.    No follow-ups on file.    I discussed the assessment and treatment plan with the patient. The patient was provided an opportunity to ask questions and all were answered. The patient agreed with the plan and demonstrated an understanding of  the instructions.   The patient was advised to call back or seek an in-person evaluation if the symptoms worsen or if the condition fails to improve as anticipated.  I provided 12 minutes of non-face-to-face time during this encounter.  , PA-C, have reviewed all documentation for this visit. The documentation on 08/19/19 for the exam, diagnosis, procedures, and orders are all accurate and complete.  10/20/19 [facility] [phone] (phone) [phone] (fax)  [facility]

## 2019-08-19 NOTE — Patient Instructions (Signed)
Chronic Constipation  Chronic constipation is a condition in which a person has three or fewer bowel movements a week, for three months or longer. This condition is especially common in older adults. The two main kinds of chronic constipation are secondary constipation and functional constipation. Secondary constipation results from another condition or a treatment. Functional constipation, also called primary or idiopathic constipation, is divided into three types:  Normal transit constipation. In this type, movement of stool through the colon (stool transit) occurs normally.  Slow transit constipation. In this type, stool moves slowly through the colon.  Outlet constipation or pelvic floor dysfunction. In this type, the nerves and muscles that empty the rectum do not work normally. What are the causes? Causes of secondary constipation may include:  Failing to drink enough fluid, eat enough food or fiber, or get physically active.  Pregnancy.  A tear in the anus (anal fissure).  Blockage in the bowel (bowel obstruction).  Narrowing of the bowel (bowel stricture).  Having a long-term medical condition, such as: ? Diabetes. ? Hypothyroidism. ? Multiple sclerosis. ? Parkinson disease. ? Stroke. ? Spinal cord injury. ? Dementia. ? Colon cancer. ? Inflammatory bowel disease (IBD). ? Iron-deficiency anemia. ? Outward collapse of the rectum (rectal prolapse). ? Hemorrhoids.  Taking certain medicines, including: ? Narcotics. These are a certain type of prescription pain medicine. ? Antacids. ? Iron supplements. ? Water pills (diuretics). ? Certain blood pressure medicines. ? Anti-seizure medicines. ? Antidepressants. ? Medicines for Parkinson disease. The cause of functional constipation is not known, but some conditions are associated with it. These conditions include:  Stress.  Problems in the nerves and muscles that control stool transit.  Weak or impaired pelvic floor  muscles. What increases the risk? You may be at higher risk for chronic constipation if you:  Are older than age 70.  Are female.  Live in a long-term care facility.  Do not get much exercise or physical activity (have a sedentary lifestyle).  Do not drink enough fluids.  Do not eat enough food, especially fiber.  Have a long-term disease.  Have a mental health disorder or eating disorder.  Take many medicines. What are the signs or symptoms? The main symptom of chronic constipation is having three or fewer bowel movements a week for several weeks. Other signs and symptoms may vary from person to person. These include:  Pushing hard (straining) to pass stool.  Painful bowel movements.  Having hard or lumpy stools.  Having lower belly discomfort, such as cramps or bloating.  Being unable to have a bowel movement when you feel the urge.  Feeling like you still need to pass stool after a bowel movement.  Feeling that you have something in your rectum that is blocking or preventing bowel movements.  Seeing blood on the toilet paper or in your stool.  Worsening confusion (in older adults). How is this diagnosed? This condition may be diagnosed based on:  Symptoms and medical history. You will be asked about your symptoms, lifestyle, diet, and any medicines that you are taking.  Physical exam. ? Your belly (abdomen) will be examined. ? A digital rectal exam may be done. For this exam, a health care provider places a lubricated, gloved finger into the rectum.  Other tests to check for any underlying causes of your constipation. These may be ordered if you have bleeding in your rectum, weight loss, or a family history of colon cancer. In these cases, you may have: ? Imaging studies of   the colon. These may include X-ray, ultrasound, or CT scan. ? Blood tests. ? A procedure to examine the inside of your colon (colonoscopy). ? More specialized tests to check:  Whether  your anal sphincter works well. This is a ring-shaped muscle that controls the closing of the anus.  How well food moves through your colon. ? Tests to measure the nerve signal in your pelvic floor muscles (electromyography). How is this treated? Treatment for chronic constipation depends on the cause. Most often, treatment starts with:  Being more active and getting regular exercise.  Drinking more fluids.  Adding fiber to your diet. Sources of fiber include fruits, vegetables, whole grains, and fiber supplements.  Using medicines such as stool softeners or medicines that increase contractions in your digestive system (pro-motility agents).  Training your pelvic muscles with biofeedback.  Surgery, if there is obstruction. Treatment for secondary chronic constipation depends on the underlying condition. You may need to:  Stop or change some medicines if they cause constipation.  Use a fiber supplement (bulk laxative) or stool softener.  Use prescription laxative. This works by absorbing water into your colon (osmotic laxative). You may also need to see a specialist who treats conditions of the digestive system (gastroenterologist). Follow these instructions at home:   Take over-the-counter and prescription medicines only as told by your health care provider.  If you are taking a laxative, take it as told by your health care provider.  Eat a balanced diet that includes enough fiber. Ask your health care provider to recommend a diet that is right for you.  Drink clear fluids, especially water. Avoid drinking alcohol, caffeine, and soda.  Drink enough fluid to keep your urine pale yellow.  Get some physical activity every day. Ask your health care provider what physical activities are safe for you.  Get colon cancer screenings as told by your health care provider.  Keep all follow-up visits as told by your health care provider. This is important. Contact a health care  provider if:  You are having three or fewer bowel movements a week.  Your stools are hard or lumpy.  You notice blood on the toilet paper or in your stool after you have a bowel movement.  You have unexplained weight loss.  You have rectum (rectal) pain.  You have stool leakage.  You experience nausea or vomiting. Get help right away if:  You have rectal bleeding or you pass blood clots.  You have severe rectal pain.  You have body tissue that pushes out (protrudes) from your anus.  You have severe pain or bloating (distension) in your abdomen.  You have vomiting that you cannot control. Summary  Chronic constipation is a condition in which a person has three or fewer bowel movements a week, for three months or longer.  You may have a higher risk for this condition if you are an older adult, or if you do not drink enough water or get enough physical activity (are sedentary).  Treatment for this condition depends on the cause. Most treatments for chronic constipation include adding fiber to your diet, drinking more fluids, and getting more physical activity. You may also need to treat any underlying medical conditions or stop or change certain medicines if they cause constipation.  If lifestyle changes do not relieve constipation, your health care provider may recommend taking a laxative. This information is not intended to replace advice given to you by your health care provider. Make sure you discuss any questions you   have with your health care provider. Document Revised: 01/06/2017 Document Reviewed: 10/11/2016 Elsevier Patient Education  2020 Elsevier Inc.  

## 2020-02-26 ENCOUNTER — Telehealth: Payer: Self-pay

## 2020-02-26 NOTE — Telephone Encounter (Signed)
Copied from CRM [id number]. Topic: General - Other >> Feb 26, 2020  8:30 AM [name] wrote: Reason for CRM: Patient called in think she may have a UTI have backache, pain in stomach, frequent urination and some pain with urinating. She also complain of a sore throat but first visit is 1.40 PM on 02/27/20 and she would like to be taken care of today so symptoms don't get worst. Please call patient at Ph# [phone]

## 2020-02-26 NOTE — Telephone Encounter (Signed)
LMTCB 02/26/2020.  [facility] please offer a virtual appointment with one of our providers this afternoon.   Thanks,   -Vernona Rieger

## 2020-02-26 NOTE — Telephone Encounter (Signed)
Copied from CRM [id number]. Topic: General - Other >> Feb 26, 2020  8:30 AM [name] wrote: Reason for CRM: Patient called in think she may have a UTI have backache, pain in stomach, frequent urination and some pain with urinating. She also complain of a sore throat but first visit is 1.40 PM on 02/27/20 and she would like to be taken care of today so symptoms don't get worst. Please call patient at Ph# [phone] >> Feb 26, 2020  1:19 PM [name], Everette A wrote: Patient called back. Unable to find an available time for this afternoon. Patient has been scheduled for Monday 03/02/20 at 7:00AM with J. Burnette Patient would like to be contacted if the Monday appointment isn't feasible.

## 2020-03-02 ENCOUNTER — Telehealth: Payer: 59 | Admitting: Physician Assistant

## 2020-03-09 ENCOUNTER — Ambulatory Visit (INDEPENDENT_AMBULATORY_CARE_PROVIDER_SITE_OTHER): Payer: 59 | Admitting: Physician Assistant

## 2020-03-09 ENCOUNTER — Other Ambulatory Visit: Payer: Self-pay

## 2020-03-09 ENCOUNTER — Encounter: Payer: Self-pay | Admitting: Physician Assistant

## 2020-03-09 VITALS — BP 132/90 | HR 97 | Temp 98.3°F | Resp 16 | Ht 62.0 in | Wt 146.0 lb

## 2020-03-09 DIAGNOSIS — F5101 Primary insomnia: Secondary | ICD-10-CM | POA: Diagnosis not present

## 2020-03-09 DIAGNOSIS — K219 Gastro-esophageal reflux disease without esophagitis: Secondary | ICD-10-CM

## 2020-03-09 DIAGNOSIS — R300 Dysuria: Secondary | ICD-10-CM

## 2020-03-09 DIAGNOSIS — R3989 Other symptoms and signs involving the genitourinary system: Secondary | ICD-10-CM

## 2020-03-09 LAB — POCT URINALYSIS DIPSTICK
Bilirubin, UA: NEGATIVE
Blood, UA: NEGATIVE
Glucose, UA: NEGATIVE
Ketones, UA: NEGATIVE
Leukocytes, UA: NEGATIVE
Nitrite, UA: NEGATIVE
Protein, UA: NEGATIVE
Spec Grav, UA: 1.015 (ref 1.010–1.025)
Urobilinogen, UA: 0.2 E.U./dL
pH, UA: 6.5 (ref 5.0–8.0)

## 2020-03-09 MED ORDER — ESZOPICLONE 3 MG PO TABS: 3.0000 mg | ORAL_TABLET | Freq: Every day | ORAL | 1 refills | Status: DC

## 2020-03-09 MED ORDER — CEPHALEXIN 500 MG PO CAPS: 500.0000 mg | ORAL_CAPSULE | Freq: Two times a day (BID) | ORAL | 0 refills | Status: DC

## 2020-03-09 MED ORDER — OMEPRAZOLE 40 MG PO CPDR: 40.0000 mg | DELAYED_RELEASE_CAPSULE | Freq: Every day | ORAL | 3 refills | Status: DC

## 2020-03-09 NOTE — Progress Notes (Signed)
Established patient visit   Patient: Ashley Wallace   DOB: [birth year]   65 y.o. Female  MRN: [id number] Visit Date: 03/09/2020  Today's healthcare provider: [name], PA-C   Chief Complaint  Patient presents with  . Urinary Tract Infection   Subjective    HPI HPI    Urinary Tract Infection    Onset: 1 to 4 weeks ago   Progression since onset: waxing and waning since onset   Pain severity: mild   Frequency: intermittently   Abdominal pain: Present   Back pain: Present   Chills: Absent   Cloudy malodorus urine: Present   Constipation: Absent   Cramping: Present   Diarrhea: Absent   Discharge: Absent   Fever: Absent   Hematuria: Present   Nausea: Absent   Vomiting: Absent       Last edited by Myles Lipps, CMA on 03/09/2020  3:30 PM. (History)     Patient reports symptoms started about 2-3 weeks ago. Started with foul smelling urine, then developed dysuria, low back pain that radiates into the right flank, and then had some right lower quadrant pain and dyspareunia.   Patient Active Problem List   Diagnosis Date Noted  . Hypercholesterolemia 12/16/2016  . Dermatitis, eczematoid 12/02/2014  . GERD (gastroesophageal reflux disease) 12/02/2014  . 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 Pre-diabetes 12/02/2014  . Pain in shoulder 12/02/2014  . Thyroid nodule 12/02/2014  . Tinea corporis 12/02/2014  . 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 Allergic rhinitis 07/14/2009  . Disorder of eustachian tube 03/26/2009  . AD (atopic dermatitis) 03/26/2009  . Chronic infection of sinus 12/27/2007  . Insomnia 12/27/2007  . Disturbance of skin sensation 08/23/2006  . Multilevel degenerative disc disease 02/07/1998   Social History   Tobacco Use  . Smoking status: Never Smoker  . Smokeless tobacco: Never Used  Substance Use Topics  . Alcohol use: No  . Drug use: No   Allergies  Allergen Reactions  . Sulfamethoxazole-Trimethoprim         Medications: Outpatient Medications Prior to Visit  Medication Sig  . Eszopiclone 3 MG TABS Take 1 tablet (3 mg total) by mouth at bedtime. Take immediately before bedtime  . [DISCONTINUED] fluticasone (FLONASE) 50 MCG/ACT nasal spray Place 2 sprays into both nostrils daily. (Patient not taking: Reported on 03/09/2020)  . [DISCONTINUED] gabapentin (NEURONTIN) 100 MG capsule TAKE 1 CAPSULE 3 TIMES DAILY (Patient not taking: Reported on 03/09/2020)  . [DISCONTINUED] ibuprofen (ADVIL,MOTRIN) 200 MG tablet Take by mouth. (Patient not taking: Reported on 03/09/2020)  . [DISCONTINUED] loratadine (CLARITIN) 10 MG tablet Take 10 mg by mouth daily. (Patient not taking: Reported on 03/09/2020)  . [DISCONTINUED] meloxicam (MOBIC) 15 MG tablet Take 15 mg by mouth daily. (Patient not taking: Reported on 03/09/2020)  . [DISCONTINUED] Multiple Vitamin (MULTIVITAMIN) tablet Take 1 tablet by mouth daily. (Patient not taking: Reported on 03/09/2020)   No facility-administered medications prior to visit.    Review of Systems  Constitutional: Negative.   HENT: Positive for ear pain.   Respiratory: Negative.   Cardiovascular: Negative.   Gastrointestinal: Positive for abdominal pain.  Genitourinary: Positive for flank pain, frequency and vaginal pain.    @AMBLABREVIEWLINK @  Objective    BP 132/90 (BP Location: Left Arm, Patient Position: Sitting, Cuff Size: Normal)   Pulse 97   Temp 98.3 F (36.8 C) (Oral)   Resp 16   Ht 5\' 2"  (1.575 m)   Wt 146 lb (66.2 kg)  SpO2 99%   BMI 26.70 kg/m  BP Readings from Last 3 Encounters:  03/09/20 132/90  05/20/19 125/85  03/05/18 126/87   Wt Readings from Last 3 Encounters:  03/09/20 146 lb (66.2 kg)  05/20/19 154 lb 12.8 oz (70.2 kg)  03/05/18 151 lb (68.5 kg)      Physical Exam Vitals reviewed.  Constitutional:      Appearance: Normal appearance. She is well-developed, well-groomed, overweight and well-nourished.  HENT:     Head: Normocephalic and  atraumatic.  Eyes:     Extraocular Movements: EOM normal.  Pulmonary:     Effort: Pulmonary effort is normal. No respiratory distress.  Musculoskeletal:     Cervical back: Normal range of motion and neck supple.  Skin:    Capillary Refill: Capillary refill takes less than 2 seconds.  Neurological:     General: No focal deficit present.     Mental Status: She is alert. Mental status is at baseline.  Psychiatric:        Mood and Affect: Mood and affect and mood normal.        Behavior: Behavior normal. Behavior is cooperative.        Thought Content: Thought content normal.        Judgment: Judgment normal.     Results for orders placed or performed in visit on 03/09/20  POCT urinalysis dipstick  Result Value Ref Range   Color, UA yellow    Clarity, UA clear    Glucose, UA Negative Negative   Bilirubin, UA Negative    Ketones, UA Negative    Spec Grav, UA 1.015 1.010 - 1.025   Blood, UA Negative    pH, UA 6.5 5.0 - 8.0   Protein, UA Negative Negative   Urobilinogen, UA 0.2 0.2 or 1.0 E.U./dL   Nitrite, UA Negative    Leukocytes, UA Negative Negative    Assessment & Plan     1. Dysuria UA unremarkable. Will send for culture.  - POCT urinalysis dipstick - Urine Culture  2. Suspected UTI Will treat as UTI as below with Keflex. Call if worsening.  - cephALEXin (KEFLEX) 500 MG capsule; Take 1 capsule (500 mg total) by mouth 2 (two) times daily.  Dispense: 14 capsule; Refill: 0  3. Primary insomnia Stable. Diagnosis pulled for medication refill. Continue current medical treatment plan. - Eszopiclone 3 MG TABS; Take 1 tablet (3 mg total) by mouth at bedtime. Take immediately before bedtime  Dispense: 90 tablet; Refill: 1  4. Gastroesophageal reflux disease without esophagitis Stable. Diagnosis pulled for medication refill. Continue current medical treatment plan. - omeprazole (PRILOSEC) 40 MG capsule; Take 1 capsule (40 mg total) by mouth daily.  Dispense: 30 capsule;  Refill: 3   No follow-ups on file.      Delmer Islam, PA-C, have reviewed all documentation for this visit. The documentation on 03/09/20 for the exam, diagnosis, procedures, and orders are all accurate and complete.   Reine Just  [facility] [phone] (phone) [phone] (fax)  [facility]

## 2020-03-11 LAB — URINE CULTURE: Organism ID, Bacteria: NO GROWTH

## 2020-03-12 ENCOUNTER — Telehealth: Payer: Self-pay

## 2020-03-12 DIAGNOSIS — H66001 Acute suppurative otitis media without spontaneous rupture of ear drum, right ear: Secondary | ICD-10-CM

## 2020-03-12 MED ORDER — AMOXICILLIN-POT CLAVULANATE 875-125 MG PO TABS: 1.0000 | ORAL_TABLET | Freq: Two times a day (BID) | ORAL | 0 refills | Status: DC

## 2020-03-12 NOTE — Addendum Note (Signed)
Addended by: [name] on: 03/12/2020 05:18 PM   Modules accepted: Orders

## 2020-03-12 NOTE — Telephone Encounter (Signed)
Patient was advised of report, she states that she started antibiotic on Monday and she states that you informed her that medication would help for urinary symptoms and for pain she was experiencing in her ear. Patient states that she is still congested and hasnt noticed improvement with her ear, patient would like to now if you want her to continue medication or will you start her on something new? KW

## 2020-03-12 NOTE — Telephone Encounter (Signed)
Keflex changed to augmentin to cover ear better.

## 2020-03-12 NOTE — Telephone Encounter (Signed)
Copied from CRM [id number]. Topic: General - Inquiry >> Mar 12, 2020  9:57 AM Ashley Wallace D wrote: Reason for CRM: Pt called saying she came in on Monday and got a UC but has not heard back from the office on the results.  She ask that a nurse call her.  CB#  [phone]

## 2020-03-13 NOTE — Telephone Encounter (Signed)
Left message advising pt.  (Per DPR)  Thanks,   -Kimaya Whitlatch  

## 2020-08-20 ENCOUNTER — Other Ambulatory Visit: Payer: Self-pay

## 2020-08-20 ENCOUNTER — Ambulatory Visit (INDEPENDENT_AMBULATORY_CARE_PROVIDER_SITE_OTHER): Payer: 59 | Admitting: Family Medicine

## 2020-08-20 VITALS — BP 116/84 | HR 81 | Temp 98.6°F | Wt 149.0 lb

## 2020-08-20 DIAGNOSIS — E7800 Pure hypercholesterolemia, unspecified: Secondary | ICD-10-CM

## 2020-08-20 DIAGNOSIS — F5101 Primary insomnia: Secondary | ICD-10-CM | POA: Diagnosis not present

## 2020-08-20 DIAGNOSIS — Z1231 Encounter for screening mammogram for malignant neoplasm of breast: Secondary | ICD-10-CM

## 2020-08-20 DIAGNOSIS — Z0000 Encounter for general adult medical examination without abnormal findings: Secondary | ICD-10-CM | POA: Diagnosis not present

## 2020-08-20 DIAGNOSIS — Z2839 Other underimmunization status: Secondary | ICD-10-CM

## 2020-08-20 MED ORDER — SERTRALINE HCL 50 MG PO TABS: 50.0000 mg | ORAL_TABLET | Freq: Every day | ORAL | 3 refills | Status: DC

## 2020-08-20 NOTE — Progress Notes (Signed)
Complete physical exam   Patient: ASYIA HORNUNG   DOB: 1955/03/25   65 y.o. Female  MRN: [id number] Visit Date: 08/20/2020  Today's healthcare provider: Dortha Kern, PA-C   No chief complaint on file.  Subjective    GEORGANNA MAXSON is a 65 y.o. female who presents today for a complete physical exam.  She reports consuming a general diet.  She generally feels well. She reports sleeping poorly. She does have additional problems to discuss today.     Past Medical History:  Diagnosis Date   Acid reflux 12/02/2014   Arthritis    Headache    Left thyroid nodule    Muscle spasms of neck 12/02/2014   Past Surgical History:  Procedure Laterality Date   CHOLECYSTECTOMY  1999   CORONARY ARTERY BYPASS GRAFT  1999   tosillectomy     TUBAL LIGATION     Social History   Socioeconomic History   Marital status: Widowed    Spouse name: Not on file   Number of children: Not on file   Years of education: Not on file   Highest education level: Not on file  Occupational History   Not on file  Tobacco Use   Smoking status: Never   Smokeless tobacco: Never  Substance and Sexual Activity   Alcohol use: No   Drug use: No   Sexual activity: Yes  Other Topics Concern   Not on file  Social History Narrative   Not on file   Social Determinants of Health   Financial Resource Strain: Not on file  Food Insecurity: Not on file  Transportation Needs: Not on file  Physical Activity: Not on file  Stress: Not on file  Social Connections: Not on file  Intimate Partner Violence: Not on file   Family Status  Relation Name Status   Mother  Deceased at age 7       cause of death was Failure To Thrive   Father  Deceased       Cause of Death was cancer-had a brain tumor, lung cancer and Emphysema   Youth worker  (Not Specified)   Sister  Alive   Brother  Alive   Daughter  Alive   Son  Alive   Sister  Alive   Brother  Deceased at age 74       lymph   Brother  Alive    Brother  Deceased       work related accident   Brother  Deceased       shot   Brother  Alive   Family History  Problem Relation Age of Onset   Hypertension Mother    Diabetes Mother        non-Insulin dependent   Breast cancer Maternal Aunt    Diabetes Sister    Hypertension Sister    Hypertension Brother    Diabetes Sister    Hypertension Sister    Cancer Brother    Leukemia Brother    Cancer Brother        kidney   Hypertension Brother    Allergies  Allergen Reactions   Sulfamethoxazole-Trimethoprim     Patient Care Team: Adanely Reynoso, Jodell Cipro, PA-C as PCP - General (Family Medicine)   Medications: Outpatient Medications Prior to Visit  Medication Sig   Eszopiclone 3 MG TABS Take 1 tablet (3 mg total) by mouth at bedtime. Take immediately before bedtime   omeprazole (PRILOSEC) 40 MG capsule Take 1 capsule (40  mg total) by mouth daily.   [DISCONTINUED] amoxicillin-clavulanate (AUGMENTIN) 875-125 MG tablet Take 1 tablet by mouth 2 (two) times daily.   No facility-administered medications prior to visit.    Review of Systems  Constitutional: Negative.   HENT: Negative.    Eyes: Negative.   Respiratory: Negative.    Cardiovascular: Negative.   Gastrointestinal: Negative.   Endocrine: Negative.   Genitourinary: Negative.   Musculoskeletal:  Positive for arthralgias, back pain, myalgias and neck pain.  Skin: Negative.   Allergic/Immunologic: Negative.   Neurological: Negative.   Hematological: Negative.   Psychiatric/Behavioral:  Positive for sleep disturbance.       Objective    BP 116/84 (BP Location: Right Arm, Patient Position: Sitting, Cuff Size: Normal)   Pulse 81   Temp 98.6 F (37 C) (Oral)   Wt 149 lb (67.6 kg)   SpO2 98%   BMI 27.25 kg/m     Physical Exam Constitutional:      Appearance: Normal appearance. She is normal weight.  HENT:     Head: Normocephalic and atraumatic.     Right Ear: Tympanic membrane, ear canal and external ear  normal.     Left Ear: Tympanic membrane, ear canal and external ear normal.     Nose: Nose normal.     Mouth/Throat:     Mouth: Mucous membranes are moist.     Pharynx: Oropharynx is clear.  Eyes:     Extraocular Movements: Extraocular movements intact.     Conjunctiva/sclera: Conjunctivae normal.     Pupils: Pupils are equal, round, and reactive to light.  Cardiovascular:     Rate and Rhythm: Normal rate and regular rhythm.     Pulses: Normal pulses.     Heart sounds: Normal heart sounds.  Pulmonary:     Effort: Pulmonary effort is normal.     Breath sounds: Normal breath sounds.  Abdominal:     General: Abdomen is flat. Bowel sounds are normal.     Palpations: Abdomen is soft.  Musculoskeletal:        General: Normal range of motion.     Cervical back: Normal range of motion and neck supple.  Skin:    General: Skin is warm and dry.  Neurological:     General: No focal deficit present.     Mental Status: She is alert and oriented to person, place, and time. Mental status is at baseline.  Psychiatric:        Mood and Affect: Mood normal.        Behavior: Behavior normal.        Thought Content: Thought content normal.        Judgment: Judgment normal.      Last depression screening scores PHQ 2/9 Scores 08/20/2020 03/09/2020 05/20/2019  PHQ - 2 Score 0 0 0  PHQ- 9 Score - 4 -   Last fall risk screening Fall Risk  08/20/2020  Falls in the past year? 0  Number falls in past yr: 0  Injury with Fall? 0  Follow up -   Last Audit-C alcohol use screening Alcohol Use Disorder Test (AUDIT) 03/09/2020  1. How often do you have a drink containing alcohol? 0  2. How many drinks containing alcohol do you have on a typical day when you are drinking? 0  3. How often do you have six or more drinks on one occasion? 0  AUDIT-C Score 0  Alcohol Brief Interventions/Follow-up AUDIT Score <7 follow-up not indicated   A  score of 3 or more in women, and 4 or more in men indicates increased  risk for alcohol abuse, EXCEPT if all of the points are from question 1   No results found for any visits on 08/20/20.  Assessment & Plan    Routine Health Maintenance and Physical Exam  Exercise Activities and Dietary recommendations  Goals   Encouraged to exercise 30-40 minutes 3-4 days a week.     Immunization History  Administered Date(s) Administered   Tdap 09/05/2012    Health Maintenance  Topic Date Due   COVID-19 Vaccine (1) Never done   Zoster Vaccines- Shingrix (1 of 2) Never done   INFLUENZA VACCINE  09/07/2020   PAP SMEAR-Modifier  03/05/2021   MAMMOGRAM  06/17/2021   COLONOSCOPY (Pts 45-64yrs Insurance coverage will need to be confirmed)  07/12/2021   TETANUS/TDAP  09/06/2022   Hepatitis C Screening  Completed   Pneumococcal Vaccine 44-70 Years old  Aged Out   HPV VACCINES  Aged Out   HIV Screening  Discontinued   1. Annual physical exam General health good. PAP normal with negative HPV on 03-05-18. Will get PAP in the next 2 years. - CBC with Differential/Platelet - Comprehensive metabolic panel - Lipid Panel With LDL/HDL Ratio - TSH  2. Hypercholesterolemia Total cholesterol was 214 with triglycerides 174 and LDL 130 on 05-20-19. Encourage to work on low fat diet and exercise regularly. Recheck labs and follow up pending reports. - CBC with Differential/Platelet - Comprehensive metabolic panel - Lipid Panel With LDL/HDL Ratio - TSH  3. Primary insomnia Wakes frequently and early each night. No help from Melatonin or the Lunesta. Will give trial of Sertraline 50 mg q hs. Recheck routine labs. - CBC with Differential/Platelet - Comprehensive metabolic panel - TSH  4. Encounter for screening mammogram for breast cancer - MM 3D SCREEN BREAST BILATERAL; Future  5. Immunizations incomplete Will check with pharmacy regarding catch up on immunizations. None wanted today.  Discussed health benefits of physical activity, and encouraged her to engage in  regular exercise appropriate for her age and condition.   No follow-ups on file.     I, Deneene Tarver, PA-C, have reviewed all documentation for this visit. The documentation on 08/20/20 for the exam, diagnosis, procedures, and orders are all accurate and complete.  Am the supervising physician for Aldine Contes, PA and I am signing off on this unfinished note with no further clinical information Megan Mans., MD   Dortha Kern, PA-C  [facility] [phone] (phone) [phone] (fax)  [facility]

## 2020-08-21 LAB — TSH: TSH: 0.84 u[IU]/mL (ref 0.450–4.500)

## 2020-08-21 LAB — CBC WITH DIFFERENTIAL/PLATELET
Basophils Absolute: 0.1 10*3/uL (ref 0.0–0.2)
Basos: 1 %
EOS (ABSOLUTE): 0.3 10*3/uL (ref 0.0–0.4)
Eos: 5 %
Hematocrit: 44.3 % (ref 34.0–46.6)
Hemoglobin: 14.5 g/dL (ref 11.1–15.9)
Immature Grans (Abs): 0 10*3/uL (ref 0.0–0.1)
Immature Granulocytes: 0 %
Lymphocytes Absolute: 1.9 10*3/uL (ref 0.7–3.1)
Lymphs: 30 %
MCH: 28.2 pg (ref 26.6–33.0)
MCHC: 32.7 g/dL (ref 31.5–35.7)
MCV: 86 fL (ref 79–97)
Monocytes Absolute: 0.5 10*3/uL (ref 0.1–0.9)
Monocytes: 8 %
Neutrophils Absolute: 3.7 10*3/uL (ref 1.4–7.0)
Neutrophils: 56 %
Platelets: 307 10*3/uL (ref 150–450)
RBC: 5.14 x10E6/uL (ref 3.77–5.28)
RDW: 12.9 % (ref 11.7–15.4)
WBC: 6.5 10*3/uL (ref 3.4–10.8)

## 2020-08-21 LAB — COMPREHENSIVE METABOLIC PANEL
ALT: 16 IU/L (ref 0–32)
AST: 24 IU/L (ref 0–40)
Albumin/Globulin Ratio: 1.9 (ref 1.2–2.2)
Albumin: 4.3 g/dL (ref 3.8–4.8)
Alkaline Phosphatase: 98 IU/L (ref 44–121)
BUN/Creatinine Ratio: 18 (ref 12–28)
BUN: 13 mg/dL (ref 8–27)
Bilirubin Total: 0.4 mg/dL (ref 0.0–1.2)
CO2: 20 mmol/L (ref 20–29)
Calcium: 9.3 mg/dL (ref 8.7–10.3)
Chloride: 104 mmol/L (ref 96–106)
Creatinine, Ser: 0.73 mg/dL (ref 0.57–1.00)
Globulin, Total: 2.3 g/dL (ref 1.5–4.5)
Glucose: 95 mg/dL (ref 65–99)
Potassium: 4.4 mmol/L (ref 3.5–5.2)
Sodium: 142 mmol/L (ref 134–144)
Total Protein: 6.6 g/dL (ref 6.0–8.5)
eGFR: 92 mL/min/{1.73_m2} (ref >59)

## 2020-08-21 LAB — LIPID PANEL WITH LDL/HDL RATIO
Cholesterol, Total: 214 mg/dL — ABNORMAL HIGH (ref 100–199)
HDL: 64 mg/dL (ref >39)
LDL Chol Calc (NIH): 136 mg/dL — ABNORMAL HIGH (ref 0–99)
LDL/HDL Ratio: 2.1 ratio (ref 0.0–3.2)
Triglycerides: 81 mg/dL (ref 0–149)
VLDL Cholesterol Cal: 14 mg/dL (ref 5–40)

## 2020-08-24 ENCOUNTER — Telehealth: Payer: Self-pay

## 2020-08-24 NOTE — Telephone Encounter (Signed)
Cholesterol is slightly high, but not enough to need medications. Every thing else is completely normal. Check yearly

## 2020-08-24 NOTE — Telephone Encounter (Signed)
Copied from CRM [id number]. Topic: General - Call Back - No Documentation >> Aug 24, 2020  3:14 PM Crist Infante wrote: Reason for CRM: pt would like call back to go over hrtlabs.  Pt doesn't have mychart aanymore, please call

## 2020-08-24 NOTE — Telephone Encounter (Signed)
Please review labs. Dennis patient. Thanks!

## 2020-08-25 ENCOUNTER — Telehealth: Payer: Self-pay

## 2020-08-25 NOTE — Telephone Encounter (Signed)
Patient is asking for someone to call her back today with her lab results please

## 2020-08-25 NOTE — Telephone Encounter (Signed)
Patient advised and verbalized understanding 

## 2020-08-25 NOTE — Telephone Encounter (Signed)
Patient checking on the status of message below. Please return patient call at [phone] mobile

## 2020-08-25 NOTE — Telephone Encounter (Signed)
Michelle Nasuti called her about labs yesterday.

## 2020-08-25 NOTE — Telephone Encounter (Signed)
Please review labs for [employer],   -Vernona Rieger

## 2020-08-25 NOTE — Telephone Encounter (Signed)
Patient is asking for  someone to please call her back today with her lab results.

## 2020-09-01 ENCOUNTER — Ambulatory Visit: Payer: Self-pay | Admitting: *Deleted

## 2020-09-01 NOTE — Telephone Encounter (Signed)
Reason for Disposition  [1] Localized rash is very painful AND [2] no fever    Aching and itching  Started yesterday, spreading  Answer Assessment - Initial Assessment Questions 1. APPEARANCE of RASH: "Describe the rash."      I have  a rash on my right.   It's itching and burning.   Located at my breast down to my pantyline 2. LOCATION: "Where is the rash located?"      Right side from my breast down to my panty line 3. NUMBER: "How many spots are there?"     Like lines 4. SIZE: "How big are the spots?" (Inches, centimeters or compare to size of a coin)      lines 5. ONSET: "When did the rash start?"      Yesterday 6. ITCHING: "Does the rash itch?" If Yes, ask: "How bad is the itch?"  (Scale 0-10; or none, mild, moderate, severe)     Yes mildly itching 7. PAIN: "Does the rash hurt?" If Yes, ask: "How bad is the pain?"  (Scale 0-10; or none, mild, moderate, severe)    - NONE (0): no pain    - MILD (1-3): doesn't interfere with normal activities     - MODERATE (4-7): interferes with normal activities or awakens from sleep     - SEVERE (8-10): excruciating pain, unable to do any normal activities     Mild aching 8. OTHER SYMPTOMS: "Do you have any other symptoms?" (e.g., fever)     No 9. PREGNANCY: "Is there any chance you are pregnant?" "When was your last menstrual period?"     N/A due to age  Protocols used: Rash or Redness - Localized-A-AH

## 2020-09-01 NOTE — Telephone Encounter (Signed)
Pt called in c/o having a rash on her right side that started yesterday and is spreading.   It extends beside her right breast down to her panty line.   It itches and aches.   It looks like "wiggly lines".    No drainage.  No fluid filled hives.   Nothing new in diet, medications, soaps, etc.  There are no appt available with Dortha Kern, PA-C during the timeframe 24 hrs indicated in the protocol so I have sent a note to [facility] for a possible work in.  Pt prefers to be contacted via her cell at [phone].  I sent my notes to [facility].

## 2020-09-01 NOTE — Telephone Encounter (Signed)
Please review. Did you want to work the patient in? Or refer to urgent care?

## 2020-09-02 ENCOUNTER — Other Ambulatory Visit: Payer: Self-pay

## 2020-09-02 ENCOUNTER — Ambulatory Visit (INDEPENDENT_AMBULATORY_CARE_PROVIDER_SITE_OTHER): Payer: 59

## 2020-09-02 ENCOUNTER — Ambulatory Visit (INDEPENDENT_AMBULATORY_CARE_PROVIDER_SITE_OTHER): Payer: 59 | Admitting: Podiatry

## 2020-09-02 ENCOUNTER — Encounter: Payer: Self-pay | Admitting: Podiatry

## 2020-09-02 DIAGNOSIS — M2011 Hallux valgus (acquired), right foot: Secondary | ICD-10-CM | POA: Diagnosis not present

## 2020-09-02 DIAGNOSIS — M778 Other enthesopathies, not elsewhere classified: Secondary | ICD-10-CM

## 2020-09-02 DIAGNOSIS — M2010 Hallux valgus (acquired), unspecified foot: Secondary | ICD-10-CM

## 2020-09-02 DIAGNOSIS — M2012 Hallux valgus (acquired), left foot: Secondary | ICD-10-CM | POA: Diagnosis not present

## 2020-09-02 MED ORDER — TRIAMCINOLONE ACETONIDE 40 MG/ML IJ SUSP
40.0000 mg | Freq: Once | INTRAMUSCULAR | Status: AC
  Administered 2020-09-02: 40 mg

## 2020-09-02 NOTE — Progress Notes (Signed)
  Subjective:  Patient ID: Ashley Wallace, female    DOB: [birth year],  MRN: [id number] HPI Chief Complaint  Patient presents with   Foot Pain    1st MPJ bilateral (L>R) - aching x several months, bunion deformities, walking and shoes aggravate, some plantar forefoot pain too   New Patient (Initial Visit)    Est pt 2017    65 y.o. female presents with the above complaint.   ROS: Denies fever chills nausea vomiting muscle aches pains calf pain back pain chest pain shortness of breath.  Past Medical History:  Diagnosis Date   Acid reflux 12/02/2014   Arthritis    Headache    Left thyroid nodule    Muscle spasms of neck 12/02/2014   Past Surgical History:  Procedure Laterality Date   CHOLECYSTECTOMY  1999   CORONARY ARTERY BYPASS GRAFT  1999   tosillectomy     TUBAL LIGATION      Current Outpatient Medications:    Eszopiclone 3 MG TABS, Take 1 tablet (3 mg total) by mouth at bedtime. Take immediately before bedtime, Disp: 90 tablet, Rfl: 1   omeprazole (PRILOSEC) 40 MG capsule, Take 1 capsule (40 mg total) by mouth daily., Disp: 30 capsule, Rfl: 3  Allergies  Allergen Reactions   Sulfamethoxazole-Trimethoprim    Review of Systems Objective:  There were no vitals filed for this visit.  General: Well developed, nourished, in no acute distress, alert and oriented x3   Dermatological: Skin is warm, dry and supple bilateral. Nails x 10 are well maintained; remaining integument appears unremarkable at this time. There are no open sores, no preulcerative lesions, no rash or signs of infection present.  Vascular: Dorsalis Pedis artery and Posterior Tibial artery pedal pulses are 2/4 bilateral with immedate capillary fill time. Pedal hair growth present. No varicosities and no lower extremity edema present bilateral.   Neruologic: Grossly intact via light touch bilateral. Vibratory intact via tuning fork bilateral. Protective threshold with Semmes Wienstein monofilament intact  to all pedal sites bilateral. Patellar and Achilles deep tendon reflexes 2+ bilateral. No Babinski or clonus noted bilateral.   Musculoskeletal: No gross boney pedal deformities bilateral. No pain, crepitus, or limitation noted with foot and ankle range of motion bilateral. Muscular strength 5/5 in all groups tested bilateral.  She has pain on palpation and end range of motion of the first metatarsophalangeal joints bilateral mild hallux valgus deformities noted bilateral.  Gait: Unassisted, Nonantalgic.    Radiographs:  Radiographs taken today demonstrate an osseously mature individual with mild osteopenia.  Significant osteoarthritic changes first metatarsophalangeal joint right greater than left.  Hallux valgus deformity looks worse on the right than the left  Assessment & Plan:   Assessment: Hallux valgus bilateral.  Capsulitis first metatarsophalangeal joint bilateral.  Plan: Discussed etiology pathology conservative surgical therapies at this point I recommended initial evaluation and treatment to consist of an injection into the first metatarsal phalangeal joint should this not alleviate her symptoms we will consider surgical intervention consisting of decompression osteotomies or most likely joint replacements..  So I went ahead and injected dexamethasone and local anesthetic after sterile Betadine skin prep intra-articularly first metatarsal phalangeal joints bilateral.  She tolerated procedure well without complications and I will like to follow-up with her in about 1 month or so.     Jalynne Persico T. Hustler, [place]

## 2020-10-30 ENCOUNTER — Encounter: Payer: Self-pay | Admitting: Family Medicine

## 2021-02-17 LAB — HM DIABETES EYE EXAM

## 2021-07-02 ENCOUNTER — Telehealth: Payer: Self-pay | Admitting: Family Medicine

## 2021-07-02 ENCOUNTER — Encounter: Payer: Self-pay | Admitting: Family Medicine

## 2021-07-02 ENCOUNTER — Ambulatory Visit (INDEPENDENT_AMBULATORY_CARE_PROVIDER_SITE_OTHER): Payer: 59 | Admitting: Family Medicine

## 2021-07-02 VITALS — BP 114/84 | HR 85 | Temp 97.8°F | Resp 14 | Wt 154.0 lb

## 2021-07-02 DIAGNOSIS — K219 Gastro-esophageal reflux disease without esophagitis: Secondary | ICD-10-CM | POA: Diagnosis not present

## 2021-07-02 DIAGNOSIS — J029 Acute pharyngitis, unspecified: Secondary | ICD-10-CM | POA: Diagnosis not present

## 2021-07-02 MED ORDER — OMEPRAZOLE 20 MG PO CPDR: 20.0000 mg | DELAYED_RELEASE_CAPSULE | Freq: Every day | ORAL | 5 refills | Status: DC

## 2021-07-02 NOTE — Patient Instructions (Signed)
.   Please review the attached list of medications and notify my office if there are any errors.   . Please bring all of your medications to every appointment so we can make sure that our medication list is the same as yours.   

## 2021-07-02 NOTE — Progress Notes (Signed)
I,Roshena L Chambers,acting as a scribe for Mila Merry, MD.,have documented all relevant documentation on the behalf of Mila Merry, MD,as directed by  Mila Merry, MD while in the presence of Mila Merry, MD.   Established patient visit   Patient: Ashley Wallace   DOB: April 02, 1955   66 y.o. Female  MRN: [id number] Visit Date: 07/02/2021  Today's healthcare provider: Mila Merry, MD   Chief Complaint  Patient presents with   Sore Throat   Subjective    Sore Throat  This is a new problem. Episode onset: 3-4 months. The problem has been unchanged. There has been no fever. Pertinent negatives include no abdominal pain, shortness of breath, swollen glands, trouble swallowing or vomiting. Associated symptoms comments: Burning sensation in throat when eating or drinking. Treatments tried: Tums. The treatment provided mild relief.   She has a long history of GERD previously on 40mg  omeprazole a day for years, but ran out several months ago. She thinks the throat pain started after running out of omeprazole.   Medications: Outpatient Medications Prior to Visit  Medication Sig   [DISCONTINUED] Eszopiclone 3 MG TABS Take 1 tablet (3 mg total) by mouth at bedtime. Take immediately before bedtime (Patient not taking: Reported on 07/02/2021)   [DISCONTINUED] omeprazole (PRILOSEC) 40 MG capsule Take 1 capsule (40 mg total) by mouth daily. (Patient not taking: Reported on 07/02/2021)   No facility-administered medications prior to visit.    Review of Systems  Constitutional:  Negative for appetite change, chills, fatigue and fever.  HENT:  Negative for trouble swallowing.        Painful swallowing  Respiratory:  Negative for chest tightness and shortness of breath.   Cardiovascular:  Negative for chest pain and palpitations.  Gastrointestinal:  Negative for abdominal pain, nausea and vomiting.  Neurological:  Negative for dizziness and weakness.      Objective    BP 114/84  (BP Location: Right Arm, Patient Position: Sitting, Cuff Size: Large)   Pulse 85   Temp 97.8 F (36.6 C) (Oral)   Resp 14   Wt 154 lb (69.9 kg)   SpO2 97% Comment: room air  BMI 28.17 kg/m    Physical Exam   General Appearance:     Well developed, well nourished female, alert, cooperative, in no acute distress  HENT:   ENT exam normal, no neck nodes or sinus tenderness  Eyes:    PERRL, conjunctiva/corneas clear, EOM's intact       Lungs:     Clear to auscultation bilaterally, respirations unlabored  Heart:    Normal heart rate. Normal rhythm. No murmurs, rubs, or gallops.    Neurologic:   Awake, alert, oriented x 3. No apparent focal neurological           defect.         Assessment & Plan     1. Sore throat Normal exam. Suspect this is an acid reflux symptom as it started after stopping PPI. She is to call for ENT referral if symptoms do not resolve within 3 weeks of starting back on PPI.   2. Gastroesophageal reflux disease without esophagitis Previously on 40mg  omeprazole but out for several months. Restart omeprazole (PRILOSEC) 20 MG capsule; Take 1-2 capsules (20-40 mg total) by mouth daily.  Dispense: 30 capsule; Refill: 5      The entirety of the information documented in the History of Present Illness, Review of Systems and Physical Exam were personally obtained by me.  Portions of this information were initially documented by the CMA and reviewed by me for thoroughness and accuracy.     Lelon Huh, MD  [facility] [phone] (phone) [phone] (fax)  [place]

## 2021-07-02 NOTE — Telephone Encounter (Signed)
Medication Refill - Medication: omeprazole (PRILOSEC) 20 MG capsule [[id number]]  Pt is calling to ask if this can be resent Pharmacy has no record that it was received.  Has the patient contacted their pharmacy? Yes.   (Agent: If no, request that the patient contact the pharmacy for the refill. If patient does not wish to contact the pharmacy document the reason why and proceed with request.) (Agent: If yes, when and what did the pharmacy advise?)  Preferred Pharmacy (with phone number or street name):  [id number] - [place], [place] - [address]  [address] [place] [address]  Phone: [phone] Fax: [phone]  Hours: Not open 24 hours   Has the patient been seen for an appointment in the last year OR does the patient have an upcoming appointment? Yes.    Agent: Please be advised that RX refills may take up to 3 business days. We ask that you follow-up with your pharmacy.

## 2021-07-02 NOTE — Telephone Encounter (Signed)
[facility] called and spoke to [place] about the refill(s) Omeprazole requested. Advised it was sent on 07/02/21 #30/5 refill(s). He says it's there, but wasn't ready if she came to pick it up, because they are backed up. It will get refilled when they get to it.

## 2021-08-25 ENCOUNTER — Telehealth: Payer: Self-pay

## 2021-08-25 NOTE — Telephone Encounter (Signed)
Copied from CRM [id number]. Topic: Referral - Request for Referral >> Aug 25, 2021  1:45 PM Franchot Heidelberg wrote: Has patient seen PCP for this complaint? Yes.   *If NO, is insurance requiring patient see PCP for this issue before PCP can refer them? Referral for which specialty: GI  Preferred provider/office: Highest recommended ([facility] if they are in network)  Reason for referral: Needs colonoscopy

## 2021-08-25 NOTE — Telephone Encounter (Signed)
Please Review

## 2021-08-30 ENCOUNTER — Other Ambulatory Visit: Payer: Self-pay | Admitting: Physician Assistant

## 2021-08-30 DIAGNOSIS — Z1211 Encounter for screening for malignant neoplasm of colon: Secondary | ICD-10-CM

## 2021-08-30 NOTE — Progress Notes (Signed)
Pt informed

## 2021-08-30 NOTE — Progress Notes (Unsigned)
Referral sent 

## 2021-08-31 ENCOUNTER — Other Ambulatory Visit: Payer: Self-pay

## 2021-08-31 ENCOUNTER — Telehealth: Payer: Self-pay

## 2021-08-31 DIAGNOSIS — Z1211 Encounter for screening for malignant neoplasm of colon: Secondary | ICD-10-CM

## 2021-08-31 MED ORDER — NA SULFATE-K SULFATE-MG SULF 17.5-3.13-1.6 GM/177ML PO SOLN: 354.0000 mL | Freq: Once | ORAL | 0 refills | Status: AC

## 2021-08-31 NOTE — Telephone Encounter (Signed)
Gastroenterology Pre-Procedure Review  Request Date: 09/30/2021 Requesting Physician: Dr. Tobi Bastos  PATIENT REVIEW QUESTIONS: The patient responded to the following health history questions as indicated:    1. Are you having any GI issues? no 2. Do you have a personal history of Polyps? Yes 10 years ago  3. Do you have a family history of Colon Cancer or Polyps? no 4. Diabetes Mellitus? no 5. Joint replacements in the past 12 months?no 6. Major health problems in the past 3 months?no 7. Any artificial heart valves, MVP, or defibrillator?no    MEDICATIONS & ALLERGIES:    Patient reports the following regarding taking any anticoagulation/antiplatelet therapy:   Plavix, Coumadin, Eliquis, Xarelto, Lovenox, Pradaxa, Brilinta, or Effient? no Aspirin? No   Patient confirms/reports the following medications:  Current Outpatient Medications  Medication Sig Dispense Refill   omeprazole (PRILOSEC) 20 MG capsule Take 1-2 capsules (20-40 mg total) by mouth daily. 30 capsule 5   No current facility-administered medications for this visit.    Patient confirms/reports the following allergies:  Allergies  Allergen Reactions   Sulfamethoxazole-Trimethoprim     No orders of the defined types were placed in this encounter.   AUTHORIZATION INFORMATION Primary Insurance: 1D#: Group #:  Secondary Insurance: 1D#: Group #:  SCHEDULE INFORMATION: Date:  Time: Location:

## 2021-09-24 ENCOUNTER — Other Ambulatory Visit: Payer: Self-pay | Admitting: Family Medicine

## 2021-09-24 DIAGNOSIS — K219 Gastro-esophageal reflux disease without esophagitis: Secondary | ICD-10-CM

## 2021-09-30 ENCOUNTER — Ambulatory Visit
Admission: RE | Admit: 2021-09-30 | Discharge: 2021-09-30 | Disposition: A | Discharge status: Home / Self Care | Payer: 59 | Attending: Gastroenterology | Admitting: Gastroenterology

## 2021-09-30 ENCOUNTER — Encounter
Admission: RE | Disposition: A | Discharge status: Home / Self Care | Payer: Self-pay | Source: Home / Self Care | Attending: Gastroenterology

## 2021-09-30 ENCOUNTER — Encounter: Payer: Self-pay | Admitting: Gastroenterology

## 2021-09-30 ENCOUNTER — Ambulatory Visit: Payer: 59 | Admitting: Registered Nurse

## 2021-09-30 ENCOUNTER — Other Ambulatory Visit: Payer: Self-pay

## 2021-09-30 DIAGNOSIS — K219 Gastro-esophageal reflux disease without esophagitis: Secondary | ICD-10-CM | POA: Insufficient documentation

## 2021-09-30 DIAGNOSIS — K5730 Diverticulosis of large intestine without perforation or abscess without bleeding: Secondary | ICD-10-CM | POA: Insufficient documentation

## 2021-09-30 DIAGNOSIS — Z79899 Other long term (current) drug therapy: Secondary | ICD-10-CM | POA: Diagnosis not present

## 2021-09-30 DIAGNOSIS — G709 Myoneural disorder, unspecified: Secondary | ICD-10-CM | POA: Diagnosis not present

## 2021-09-30 DIAGNOSIS — Z1211 Encounter for screening for malignant neoplasm of colon: Secondary | ICD-10-CM

## 2021-09-30 HISTORY — PX: COLONOSCOPY WITH PROPOFOL: SHX5780

## 2021-09-30 SURGERY — COLONOSCOPY WITH PROPOFOL
Anesthesia: General

## 2021-09-30 MED ORDER — PROPOFOL 10 MG/ML IV BOLUS
INTRAVENOUS | Status: DC | PRN
  Administered 2021-09-30: 70 mg via INTRAVENOUS

## 2021-09-30 MED ORDER — PROPOFOL 1000 MG/100ML IV EMUL
INTRAVENOUS | Status: AC
  Filled 2021-09-30: qty 300

## 2021-09-30 MED ORDER — PHENYLEPHRINE HCL (PRESSORS) 10 MG/ML IV SOLN
INTRAVENOUS | Status: DC | PRN
  Administered 2021-09-30: 80 ug via INTRAVENOUS

## 2021-09-30 MED ORDER — SODIUM CHLORIDE 0.9 % IV SOLN: INTRAVENOUS | Status: DC

## 2021-09-30 MED ORDER — LIDOCAINE HCL (CARDIAC) PF 100 MG/5ML IV SOSY
PREFILLED_SYRINGE | INTRAVENOUS | Status: DC | PRN
  Administered 2021-09-30: 60 mg via INTRAVENOUS

## 2021-09-30 MED ORDER — PROPOFOL 500 MG/50ML IV EMUL
INTRAVENOUS | Status: DC | PRN
  Administered 2021-09-30: 140 ug/kg/min via INTRAVENOUS

## 2021-09-30 NOTE — Op Note (Signed)
[facility] Patient Name: Ashley Wallace Procedure Date: 09/30/2021 7:38 AM MRN: [id number] Account #: [id number] Date of Birth: 10-18-1955 Admit Type: Outpatient Age: 66 Room: [facility] ENDO ROOM 3 Gender: Female Note Status: Finalized Instrument Name: Prentice Docker [id number] Procedure:             Colonoscopy Indications:           Screening for colorectal malignant neoplasm Providers:             Wyline Mood MD, MD Referring MD:          Demetrios Isaacs. Sherrie Mustache, MD (Referring MD) Medicines:             Monitored Anesthesia Care Complications:         No immediate complications. Procedure:             Pre-Anesthesia Assessment:                        - Prior to the procedure, a History and Physical was                         performed, and patient medications, allergies and                         sensitivities were reviewed. The patient's tolerance                         of previous anesthesia was reviewed.                        - The risks and benefits of the procedure and the                         sedation options and risks were discussed with the                         patient. All questions were answered and informed                         consent was obtained.                        - ASA Grade Assessment: II - A patient with mild                         systemic disease.                        After obtaining informed consent, the colonoscope was                         passed under direct vision. Throughout the procedure,                         the patient's blood pressure, pulse, and oxygen                         saturations were monitored continuously. The                         Colonoscope was  introduced through the anus and                         advanced to the the cecum, identified by the                         appendiceal orifice. The colonoscopy was performed                         with ease. The patient tolerated the procedure  well.                         The quality of the bowel preparation was excellent. Findings:      The perianal and digital rectal examinations were normal.      Multiple small and large-mouthed diverticula were found in the left       colon.      The exam was otherwise without abnormality on direct and retroflexion       views. Impression:            - Diverticulosis in the left colon.                        - The examination was otherwise normal on direct and                         retroflexion views.                        - No specimens collected. Recommendation:        - Discharge patient to home (with escort).                        - Resume previous diet.                        - Continue present medications.                        - Repeat colonoscopy in 10 years for screening                         purposes. Procedure Code(s):     --- Professional ---                        [phone], Colonoscopy, flexible; diagnostic, including                         collection of specimen(s) by brushing or washing, when                         performed (separate procedure) Diagnosis Code(s):     --- Professional ---                        Z12.11, Encounter for screening for malignant neoplasm                         of colon  K57.30, Diverticulosis of large intestine without                         perforation or abscess without bleeding CPT copyright 2019 American [employer] Association. All rights reserved. The codes documented in this report are preliminary and upon coder review may  be revised to meet current compliance requirements. Wyline Mood, MD Wyline Mood MD, MD 09/30/2021 8:05:39 AM This report has been signed electronically. Number of Addenda: 0 Note Initiated On: 09/30/2021 7:38 AM Scope Withdrawal Time: 0 hours 8 minutes 50 seconds  Total Procedure Duration: 0 hours 12 minutes 40 seconds  Estimated Blood Loss:  Estimated blood loss: none.      [facility]

## 2021-09-30 NOTE — Anesthesia Postprocedure Evaluation (Signed)
Anesthesia Post Note  Patient: Ashley Wallace  Procedure(s) Performed: COLONOSCOPY WITH PROPOFOL  Patient location during evaluation: PACU Anesthesia Type: General Level of consciousness: awake and alert Pain management: pain level controlled Vital Signs Assessment: post-procedure vital signs reviewed and stable Respiratory status: spontaneous breathing, nonlabored ventilation, respiratory function stable and patient connected to nasal cannula oxygen Cardiovascular status: blood pressure returned to baseline and stable Postop Assessment: no apparent nausea or vomiting Anesthetic complications: no   No notable events documented.   Last Vitals:  Vitals:   09/30/21 0702 09/30/21 0808  BP: 136/87 100/67  Pulse: 79   Resp: 18   Temp: 36.4 C (!) 36.4 C  SpO2: 97%     Last Pain:  Vitals:   09/30/21 0828  TempSrc:   PainSc: 0-No pain                 Yevette Edwards

## 2021-09-30 NOTE — Anesthesia Preprocedure Evaluation (Signed)
Anesthesia Evaluation  Patient identified by MRN, date of birth, ID band Patient awake    Reviewed: Allergy & Precautions, H&P , NPO status , Patient's Chart, lab work & pertinent test results, reviewed documented beta blocker date and time   Airway Mallampati: II   Neck ROM: full    Dental  (+) Poor Dentition   Pulmonary neg pulmonary ROS,    Pulmonary exam normal        Cardiovascular negative cardio ROS Normal cardiovascular exam Rhythm:regular Rate:Normal     Neuro/Psych  Headaches,  Neuromuscular disease negative psych ROS   GI/Hepatic negative GI ROS, Neg liver ROS, GERD  Medicated,  Endo/Other  negative endocrine ROS  Renal/GU negative Renal ROS  negative genitourinary   Musculoskeletal   Abdominal   Peds  Hematology negative hematology ROS (+)   Anesthesia Other Findings Past Medical History: 12/02/2014: Acid reflux No date: Arthritis No date: Headache No date: Left thyroid nodule 12/02/2014: Muscle spasms of neck Past Surgical History: 1999: CHOLECYSTECTOMY No date: tosillectomy No date: TUBAL LIGATION BMI    Body Mass Index: 27.44 kg/m     Reproductive/Obstetrics negative OB ROS                             Anesthesia Physical Anesthesia Plan  ASA: 2  Anesthesia Plan: General   Post-op Pain Management:    Induction:   PONV Risk Score and Plan:   Airway Management Planned:   Additional Equipment:   Intra-op Plan:   Post-operative Plan:   Informed Consent: I have reviewed the patients History and Physical, chart, labs and discussed the procedure including the risks, benefits and alternatives for the proposed anesthesia with the patient or authorized representative who has indicated his/her understanding and acceptance.     Dental Advisory Given  Plan Discussed with: CRNA  Anesthesia Plan Comments:         Anesthesia Quick Evaluation

## 2021-09-30 NOTE — Transfer of Care (Signed)
Immediate Anesthesia Transfer of Care Note  Patient: Ashley Wallace  Procedure(s) Performed: COLONOSCOPY WITH PROPOFOL  Patient Location: PACU  Anesthesia Type:General  Level of Consciousness: awake, alert  and oriented  Airway & Oxygen Therapy: Patient Spontanous Breathing  Post-op Assessment: Report given to RN  Post vital signs: Reviewed and stable  Last Vitals:  Vitals Value Taken Time  BP 100/67 09/30/21 0807  Temp    Pulse 71 09/30/21 0810  Resp 13 09/30/21 0810  SpO2 96 % 09/30/21 0810  Vitals shown include unvalidated device data.  Last Pain:  Vitals:   09/30/21 0702  TempSrc: Temporal  PainSc: 0-No pain         Complications: No notable events documented.

## 2021-09-30 NOTE — H&P (Signed)
Ashley Mood, MD [address], [address], [place], [place], [address] [address], [address], [place], [place], [address] Phone: [phone]  Fax: [phone]  Primary Care Physician:  Malva Limes, MD   Pre-Procedure History & Physical: HPI:  Ashley Wallace is a 66 y.o. female is here for an colonoscopy.   Past Medical History:  Diagnosis Date   Acid reflux 12/02/2014   Arthritis    Headache    Left thyroid nodule    Muscle spasms of neck 12/02/2014    Past Surgical History:  Procedure Laterality Date   CHOLECYSTECTOMY  1999   tosillectomy     TUBAL LIGATION      Prior to Admission medications   Medication Sig Start Date End Date Taking? Authorizing Provider  omeprazole (PRILOSEC) 20 MG capsule TAKE 1 TO 2 CAPSULES (20-40 MG TOTAL) BY MOUTH DAILY 09/24/21  Yes Malva Limes, MD    Allergies as of 08/31/2021 - Review Complete 07/02/2021  Allergen Reaction Noted   Sulfamethoxazole-trimethoprim  05/20/2019    Family History  Problem Relation Age of Onset   Hypertension Mother    Diabetes Mother        non-Insulin dependent   Breast cancer Maternal Aunt    Diabetes Sister    Hypertension Sister    Hypertension Brother    Diabetes Sister    Hypertension Sister    Cancer Brother    Leukemia Brother    Cancer Brother        kidney   Hypertension Brother     Social History   Socioeconomic History   Marital status: Widowed    Spouse name: Not on Wallace   Number of children: Not on Wallace   Years of education: Not on Wallace   Highest education level: Not on Wallace  Occupational History   Not on Wallace  Tobacco Use   Smoking status: Never   Smokeless tobacco: Never  Vaping Use   Vaping Use: Never used  Substance and Sexual Activity   Alcohol use: No   Drug use: No   Sexual activity: Yes  Other Topics Concern   Not on Wallace  Social History Narrative   Not on Wallace   Social Determinants of Health   Financial Resource Strain: Not on Wallace   Food Insecurity: Not on Wallace  Transportation Needs: Not on Wallace  Physical Activity: Not on Wallace  Stress: Not on Wallace  Social Connections: Not on Wallace  Intimate Partner Violence: Not on Wallace    Review of Systems: See HPI, otherwise negative ROS  Physical Exam: BP 136/87   Pulse 79   Temp 97.6 F (36.4 C) (Temporal)   Resp 18   Ht 5\' 2"  (1.575 m)   Wt 68 kg   SpO2 97%   BMI 27.44 kg/m  General:   Alert,  pleasant and cooperative in NAD Head:  Normocephalic and atraumatic. Neck:  Supple; no masses or thyromegaly. Lungs:  Clear throughout to auscultation, normal respiratory effort.    Heart:  +S1, +S2, Regular rate and rhythm, No edema. Abdomen:  Soft, nontender and nondistended. Normal bowel sounds, without guarding, and without rebound.   Neurologic:  Alert and  oriented x4;  grossly normal neurologically.  Impression/Plan: [name] is here for an colonoscopy to be performed for Screening colonoscopy average risk   Risks, benefits, limitations, and alternatives regarding  colonoscopy have been reviewed with the patient.  Questions have been answered.  All parties  agreeable.   Ashley Mood, MD  09/30/2021, 7:43 AM

## 2021-10-01 ENCOUNTER — Encounter: Payer: Self-pay | Admitting: Gastroenterology

## 2021-10-28 ENCOUNTER — Telehealth: Payer: Self-pay | Admitting: Family Medicine

## 2021-10-28 DIAGNOSIS — K219 Gastro-esophageal reflux disease without esophagitis: Secondary | ICD-10-CM

## 2021-10-28 NOTE — Telephone Encounter (Signed)
Medication Refill - Medication: Patient insurance will only cover 3 refills of omeprazole (PRILOSEC) 20 MG capsule in 1 year and the out of pocket cost is $78. Patient is completely out and does not want to pay the out of pocket cost but would like PCP to prescribe alternate,    Has the patient contacted their pharmacy? Yes.     (Agent: If yes, when and what did the pharmacy advise?) Contact PCP office  Preferred Pharmacy (with phone number or street name):   [id number] - [place], [address] Phone:  [phone]  Fax:  [phone]      Has the patient been seen for an appointment in the last year OR does the patient have an upcoming appointment? Yes.    Agent: Please be advised that RX refills may take up to 3 business days. We ask that you follow-up with your pharmacy.

## 2021-10-28 NOTE — Telephone Encounter (Signed)
Please Review

## 2021-10-29 IMAGING — MG DIGITAL SCREENING BILAT W/ TOMO W/ CAD
8 series · 8 of 24 positions shown · non-contrast
Comparison: Previous exam(s).

CLINICAL DATA: Screening.

EXAM:
DIGITAL SCREENING BILATERAL MAMMOGRAM WITH TOMO AND CAD

[L CC synth-2D]
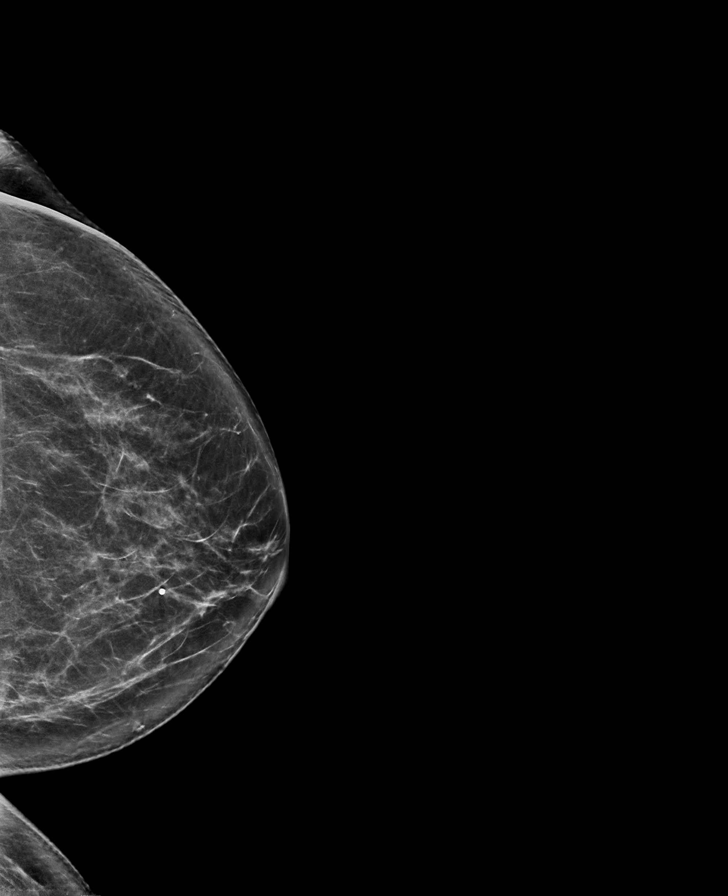

[R CC synth-2D]
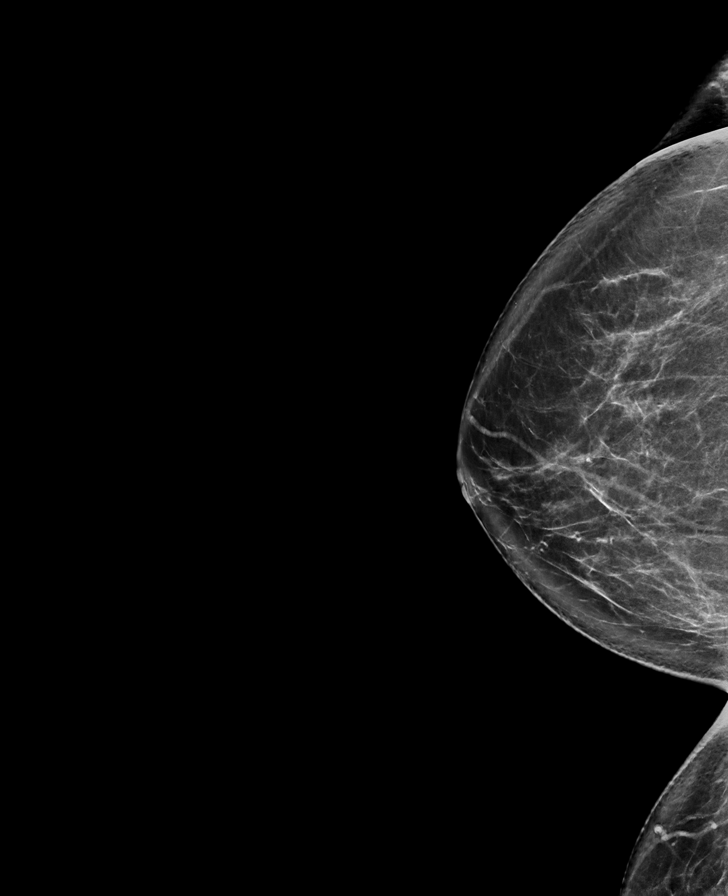

[L MLO synth-2D]
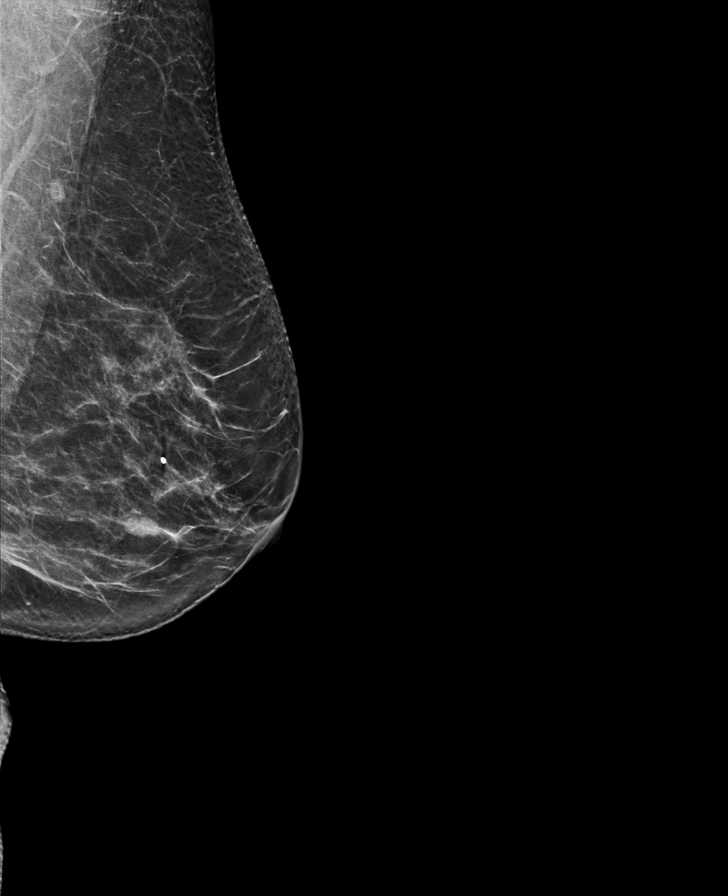

[R MLO synth-2D]
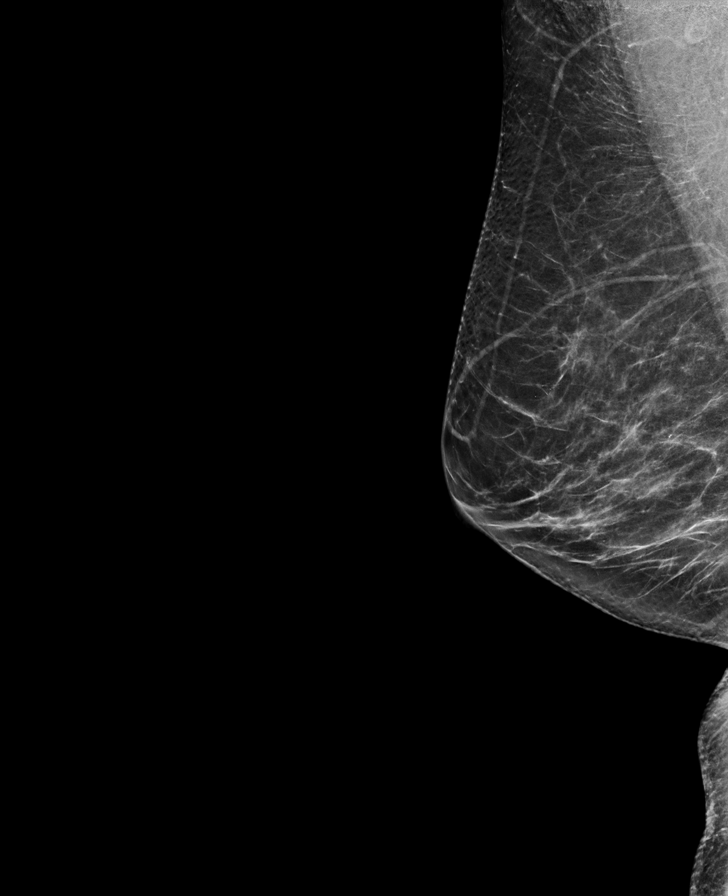

[L MLO tomo · tomo slice 38/75.0]
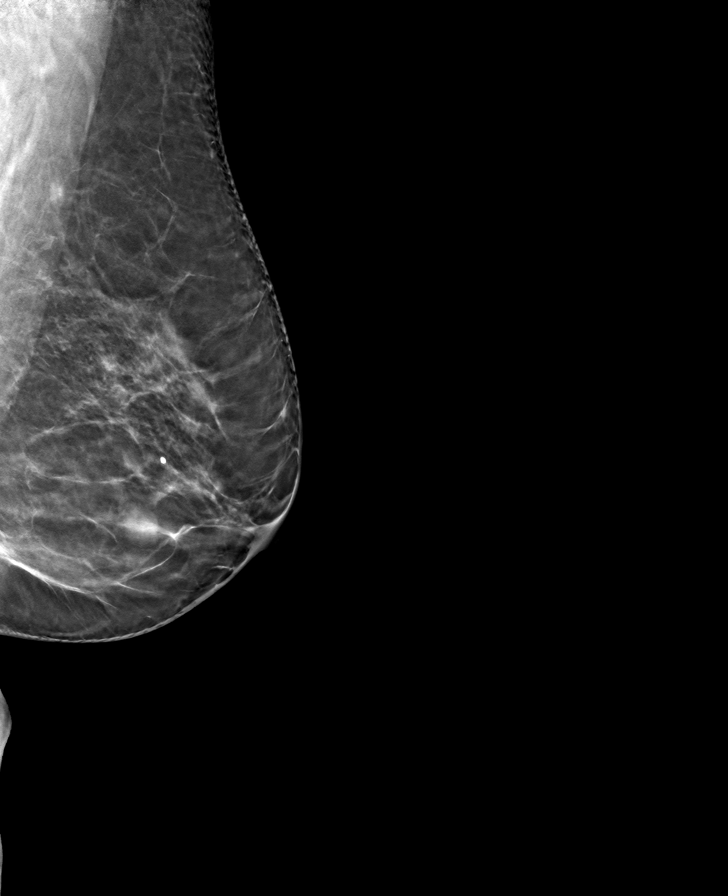

[L CC tomo · tomo slice 38/75.0]
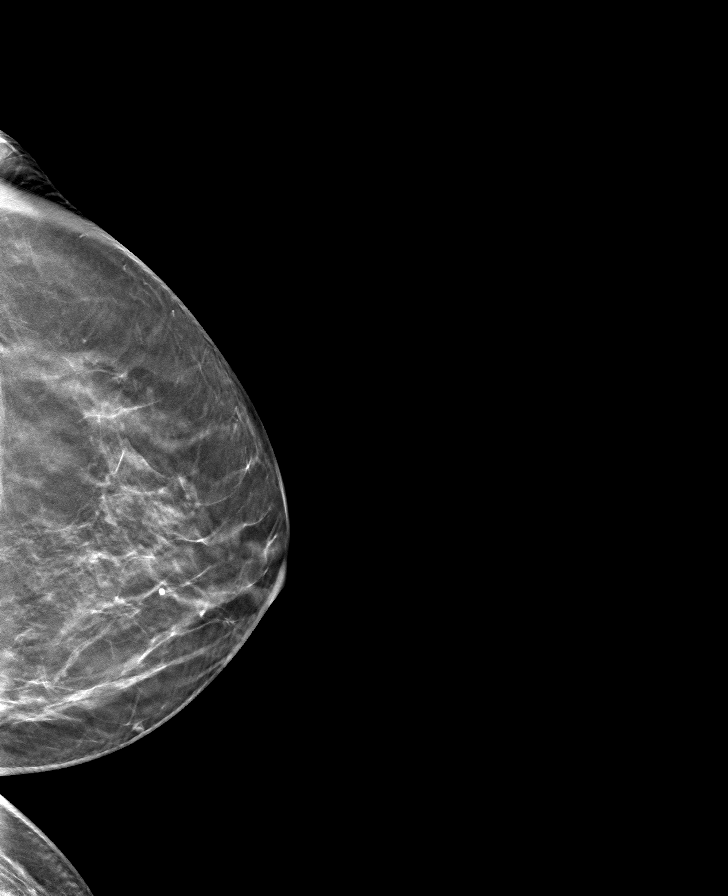

[R MLO tomo · tomo slice 35/70.0]
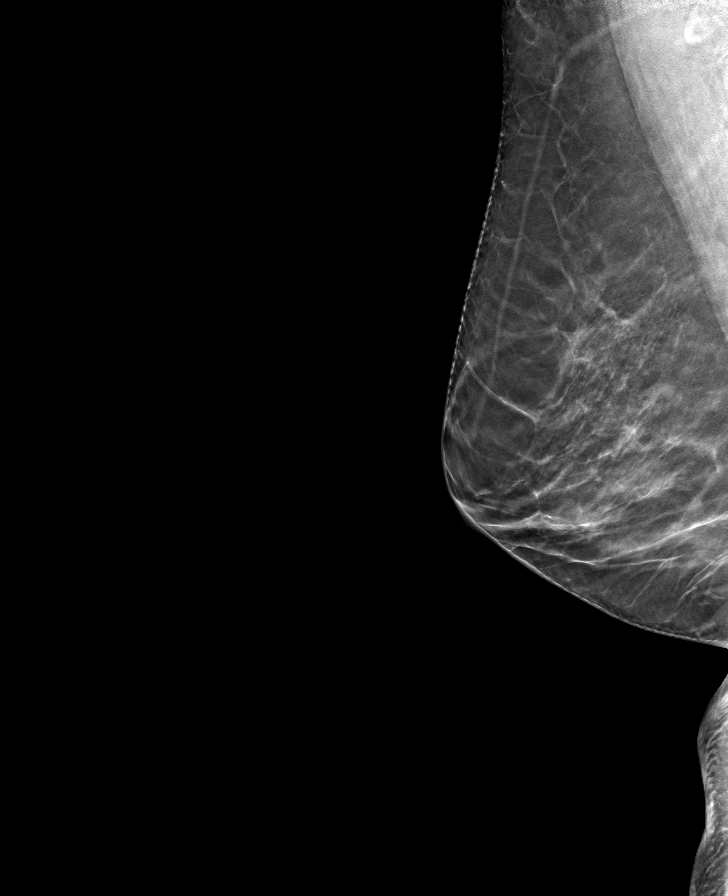

[R CC tomo · tomo slice 41/82.0]
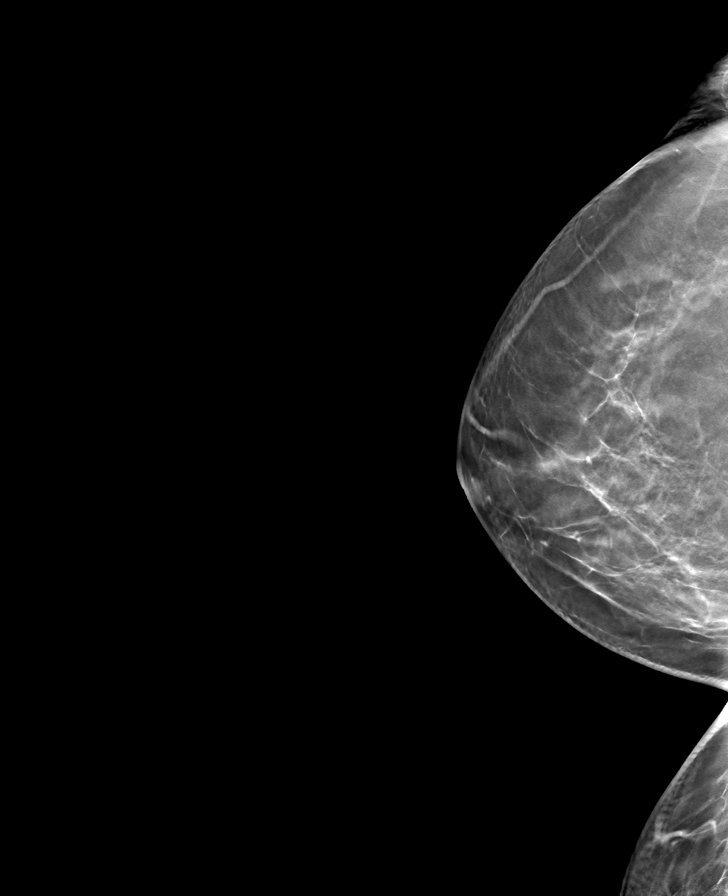

[8 of 24 positions shown; findings below may reference images not displayed]

ACR Breast Density Category b: There are scattered areas of
fibroglandular density.
FINDINGS: There are no findings suspicious for malignancy. Images were
processed with CAD.
IMPRESSION: No mammographic evidence of malignancy. A result letter of this
screening mammogram will be mailed directly to the patient.

RECOMMENDATION:
Screening mammogram in one year. (Code:[id number])

BI-RADS CATEGORY  1: Negative.

## 2021-10-29 MED ORDER — FAMOTIDINE 20 MG PO TABS: 20.0000 mg | ORAL_TABLET | Freq: Every day | ORAL | 12 refills | Status: DC

## 2022-01-07 ENCOUNTER — Telehealth: Payer: 59 | Admitting: Physician Assistant

## 2022-01-07 ENCOUNTER — Ambulatory Visit: Payer: Self-pay

## 2022-01-07 DIAGNOSIS — U071 COVID-19: Secondary | ICD-10-CM | POA: Diagnosis not present

## 2022-01-07 MED ORDER — PROMETHAZINE-DM 6.25-15 MG/5ML PO SYRP: 5.0000 mL | ORAL_SOLUTION | Freq: Four times a day (QID) | ORAL | 0 refills | Status: DC | PRN

## 2022-01-07 MED ORDER — BENZONATATE 100 MG PO CAPS: 100.0000 mg | ORAL_CAPSULE | Freq: Three times a day (TID) | ORAL | 0 refills | Status: DC | PRN

## 2022-01-07 MED ORDER — MOLNUPIRAVIR EUA 200MG CAPSULE: 4.0000 | ORAL_CAPSULE | Freq: Two times a day (BID) | ORAL | 0 refills | Status: AC

## 2022-01-07 NOTE — Telephone Encounter (Signed)
  Chief Complaint: COVID+ Symptoms: Cough, sore throat, Runny nose, HA Frequency: since Tuesday Pertinent Negatives: Patient denies fever Disposition: [] ED /[] Urgent Care (no appt availability in office) / [] Appointment(In office/virtual)/ [x]  [facility]/ [] Home Care/ [] Refused Recommended Disposition /[] [facility]/ []  Follow-up with PCP Additional Notes: PT was caring for her MIL with COVID. PT started with s/s Tuesday . PT tested negative weds. Last night tested +. Pt is interested in antivirals.    Summary: COVID positive cough, headache, and runny nose.   Pt stated COVID positive yesterday; current symptoms are cough, headache, and runny nose.  Pt is requesting an Rx be sent in for her. Pt stated her nose was burning as well.  Pt seeking clinical advice.     Reason for Disposition  [1] HIGH RISK patient (e.g., weak immune system, age > 64 years, obesity with BMI 30 or higher, pregnant, chronic lung disease or other chronic medical condition) AND [2] COVID symptoms (e.g., cough, fever)  (Exceptions: Already seen by PCP and no new or worsening symptoms.)  Answer Assessment - Initial Assessment Questions 1. COVID-19 DIAGNOSIS: "How do you know that you have COVID?" (e.g., positive lab test or self-test, diagnosed by doctor or NP/PA, symptoms after exposure).     Home test last night 2. COVID-19 EXPOSURE: "Was there any known exposure to COVID before the symptoms began?" CDC Definition of close contact: within 6 feet (2 meters) for a total of 15 minutes or more over a 24-hour period.      Mother in law 3. ONSET: "When did the COVID-19 symptoms start?"      Since Tuesday 4. WORST SYMPTOM: "What is your worst symptom?" (e.g., cough, fever, shortness of breath, muscle aches)     Cough, HA, sore throat 5. COUGH: "Do you have a cough?" If Yes, ask: "How bad is the cough?"       Cough - both wet and dry 6. FEVER: "Do you have a fever?" If Yes, ask: "What is your  temperature, how was it measured, and when did it start?"     no 7. RESPIRATORY STATUS: "Describe your breathing?" (e.g., normal; shortness of breath, wheezing, unable to speak)      no 8. BETTER-SAME-WORSE: "Are you getting better, staying the same or getting worse compared to yesterday?"  If getting worse, ask, "In what way?"     worse 9. OTHER SYMPTOMS: "Do you have any other symptoms?"  (e.g., chills, fatigue, headache, loss of smell or taste, muscle pain, sore throat)     Nose is burning - runny nose 10. HIGH RISK DISEASE: "Do you have any chronic medical problems?" (e.g., asthma, heart or lung disease, weak immune system, obesity, etc.)       no 11. VACCINE: "Have you had the COVID-19 vaccine?" If Yes, ask: "Which one, how many shots, when did you get it?"        12. PREGNANCY: "Is there any chance you are pregnant?" "When was your last menstrual period?"        13. O2 SATURATION MONITOR:  "Do you use an oxygen saturation monitor (pulse oximeter) at home?" If Yes, ask "What is your reading (oxygen level) today?" "What is your usual oxygen saturation reading?" (e.g., 95%)  Protocols used: Coronavirus (COVID-19) Diagnosed or Suspected-A-AH

## 2022-01-07 NOTE — Patient Instructions (Signed)
Ashley Wallace, thank you for joining [name], PA-C for today's virtual visit.  While this provider is not your primary care provider (PCP), if your PCP is located in our provider database this encounter information will be shared with them immediately following your visit.   A [facility] MyChart account gives you access to today's visit and all your visits, tests, and labs performed at [facility] " click here if you don't have a [facility] MyChart account or go to [email or web address]  Consent: (Patient) Ashley Wallace provided verbal consent for this virtual visit at the beginning of the encounter.  Current Medications:  Current Outpatient Medications:    benzonatate (TESSALON) 100 MG capsule, Take 1 capsule (100 mg total) by mouth 3 (three) times daily as needed., Disp: 30 capsule, Rfl: 0   molnupiravir EUA (LAGEVRIO) 200 mg CAPS capsule, Take 4 capsules (800 mg total) by mouth 2 (two) times daily for 5 days., Disp: 40 capsule, Rfl: 0   promethazine-dextromethorphan (PROMETHAZINE-DM) 6.25-15 MG/5ML syrup, Take 5 mLs by mouth 4 (four) times daily as needed., Disp: 118 mL, Rfl: 0   famotidine (PEPCID) 20 MG tablet, Take 1 tablet (20 mg total) by mouth daily., Disp: 30 tablet, Rfl: 12   omeprazole (PRILOSEC) 20 MG capsule, TAKE 1 TO 2 CAPSULES (20-40 MG TOTAL) BY MOUTH DAILY, Disp: 30 capsule, Rfl: 5   Medications ordered in this encounter:  Meds ordered this encounter  Medications   molnupiravir EUA (LAGEVRIO) 200 mg CAPS capsule    Sig: Take 4 capsules (800 mg total) by mouth 2 (two) times daily for 5 days.    Dispense:  40 capsule    Refill:  0    Order Specific Question:   Supervising Provider    Answer:   Merrilee Jansky [id number]   promethazine-dextromethorphan (PROMETHAZINE-DM) 6.25-15 MG/5ML syrup    Sig: Take 5 mLs by mouth 4 (four) times daily as needed.    Dispense:  118 mL    Refill:  0    Order Specific Question:   Supervising  Provider    Answer:   Merrilee Jansky [[id number]]   benzonatate (TESSALON) 100 MG capsule    Sig: Take 1 capsule (100 mg total) by mouth 3 (three) times daily as needed.    Dispense:  30 capsule    Refill:  0    Order Specific Question:   Supervising Provider    Answer:   Merrilee Jansky [id number]     *If you need refills on other medications prior to your next appointment, please contact your pharmacy*  Follow-Up: Call back or seek an in-person evaluation if the symptoms worsen or if the condition fails to improve as anticipated.  [facility] [phone]  Other Instructions  Can take to lessen severity: Vit C 500mg  twice daily Quercertin 250-500mg  twice daily Zinc 75-100mg  daily Melatonin 3-6 mg at bedtime Vit D3 1000-2000 IU daily Aspirin 81 mg daily with food Optional: Famotidine 20mg  daily Also can add tylenol/ibuprofen as needed for fevers and body aches May add Mucinex or Mucinex DM as needed for cough/congestion   COVID-19 COVID-19, or coronavirus disease 2019, is an infection that is caused by a new (novel) coronavirus called SARS-CoV-2. COVID-19 can cause many symptoms. In some people, the virus may not cause any symptoms. In others, it may cause mild or severe symptoms. Some people with severe infection develop severe disease. What are the causes? This illness is caused by a virus.  The virus may be in the air as tiny specks of fluid (aerosols) or droplets, or it may be on surfaces. You may catch the virus by: Breathing in droplets from an infected person. Droplets can be spread by a person breathing, speaking, singing, coughing, or sneezing. Touching something, like a table or a doorknob, that has virus on it (is contaminated) and then touching your mouth, nose, or eyes. What increases the risk? Risk for infection: You are more likely to get infected with the COVID-19 virus if: You are within 6 ft (1.8 m) of a person with COVID-19 for 15 minutes or  longer. You are providing care for a person who is infected with COVID-19. You are in close personal contact with other people. Close personal contact includes hugging, kissing, or sharing eating or drinking utensils. Risk for serious illness caused by COVID-19: You are more likely to get seriously ill from the COVID-19 virus if: You have cancer. You have a long-term (chronic) disease, such as: Chronic lung disease. This includes pulmonary embolism, chronic obstructive pulmonary disease, and cystic fibrosis. Long-term disease that lowers your body's ability to fight infection (immunocompromise). Serious cardiac conditions, such as heart failure, coronary artery disease, or cardiomyopathy. Diabetes. Chronic kidney disease. Liver diseases. These include cirrhosis, nonalcoholic fatty liver disease, alcoholic liver disease, or autoimmune hepatitis. You have obesity. You are pregnant or were recently pregnant. You have sickle cell disease. What are the signs or symptoms? Symptoms of this condition can range from mild to severe. Symptoms may appear any time from 2 to 14 days after being exposed to the virus. They include: Fever or chills. Shortness of breath or trouble breathing. Feeling tired or very tired. Headaches, body aches, or muscle aches. Runny or stuffy nose, sneezing, coughing, or sore throat. New loss of taste or smell. This is rare. Some people may also have stomach problems, such as nausea, vomiting, or diarrhea. Other people may not have any symptoms of COVID-19. How is this diagnosed? This condition may be diagnosed by testing samples to check for the COVID-19 virus. The most common tests are the PCR test and the antigen test. Tests may be done in the lab or at home. They include: Using a swab to take a sample of fluid from the back of your nose and throat (nasopharyngeal fluid), from your nose, or from your throat. Testing a sample of saliva from your mouth. Testing a sample  of coughed-up mucus from your lungs (sputum). How is this treated? Treatment for COVID-19 infection depends on the severity of the condition. Mild symptoms can be managed at home with rest, fluids, and over-the-counter medicines. Serious symptoms may be treated in a hospital intensive care unit (ICU). Treatment in the ICU may include: Supplemental oxygen. Extra oxygen is given through a tube in the nose, a face mask, or a hood. Medicines. These may include: Antivirals, such as monoclonal antibodies. These help your body fight off certain viruses that can cause disease. Anti-inflammatories, such as corticosteroids. These reduce inflammation and suppress the immune system. Antithrombotics. These prevent or treat blood clots, if they develop. Convalescent plasma. This helps boost your immune system, if you have an underlying immunosuppressive condition or are getting immunosuppressive treatments. Prone positioning. This means you will lie on your stomach. This helps oxygen to get into your lungs. Infection control measures. If you are at risk for more serious illness caused by COVID-19, your health care provider may prescribe two long-acting monoclonal antibodies, given together every 6 months. How is  this prevented? To protect yourself: Use preventive medicine (pre-exposure prophylaxis). You may get pre-exposure prophylaxis if you have moderate or severe immunocompromise. Get vaccinated. Anyone 30 months old or older who meets guidelines can get a COVID-19 vaccine or vaccine series. This includes people who are pregnant or making breast milk (lactating). Get an added dose of COVID-19 vaccine after your first vaccine or vaccine series if you have moderate to severe immunocompromise. This applies if you have had a solid organ transplant or have been diagnosed with an immunocompromising condition. You should get the added dose 4 weeks after you got the first COVID-19 vaccine or vaccine series. If you  get an mRNA vaccine, you will need a 3-dose primary series. If you get the J&J/Janssen vaccine, you will need a 2-dose primary series, with the second dose being an mRNA vaccine. Talk to your health care provider about getting experimental monoclonal antibodies. This treatment is approved under emergency use authorization to prevent severe illness before or after being exposed to the COVID-19 virus. You may be given monoclonal antibodies if: You have moderate or severe immunocompromise. This includes treatments that lower your immune response. People with immunocompromise may not develop protection against COVID-19 when they are vaccinated. You cannot be vaccinated. You may not get a vaccine if you have a severe allergic reaction to the vaccine or its components. You are not fully vaccinated. You are in a facility where COVID-19 is present and: Are in close contact with a person who is infected with the COVID-19 virus. Are at high risk of being exposed to the COVID-19 virus. You are at risk of illness from new variants of the COVID-19 virus. To protect others: If you have symptoms of COVID-19, take steps to prevent the virus from spreading to others. Stay home. Leave your house only to get medical care. Do not use public transit, if possible. Do not travel while you are sick. Wash your hands often with soap and water for at least 20 seconds. If soap and water are not available, use alcohol-based hand sanitizer. Make sure that all people in your household wash their hands well and often. Cough or sneeze into a tissue or your sleeve or elbow. Do not cough or sneeze into your hand or into the air. Where to find more information Centers for Disease Control and Prevention: https://www.clark-whitaker.org/ World Health Organization: https://thompson-craig.com/ Get help right away if: You have trouble breathing. You have pain or pressure in your chest. You are confused. You have bluish lips and  fingernails. You have trouble waking from sleep. You have symptoms that get worse. These symptoms may be an emergency. Get help right away. Call 911. Do not wait to see if the symptoms will go away. Do not drive yourself to the hospital. Summary COVID-19 is an infection that is caused by a new coronavirus. Sometimes, there are no symptoms. Other times, symptoms range from mild to severe. Some people with a severe COVID-19 infection develop severe disease. The virus that causes COVID-19 can spread from person to person through droplets or aerosols from breathing, speaking, singing, coughing, or sneezing. Mild symptoms of COVID-19 can be managed at home with rest, fluids, and over-the-counter medicines. This information is not intended to replace advice given to you by your health care provider. Make sure you discuss any questions you have with your health care provider. Document Revised: 01/12/2021 Document Reviewed: 01/14/2021 Elsevier Patient Education  2023 [employer].    If you have been instructed to have an  in-person evaluation today at a local Urgent Care facility, please use the link below. It will take you to a list of all of our available [facility], including address, phone number and hours of operation. Please do not delay care.  [facility]  If you or a family member do not have a primary care provider, use the link below to schedule a visit and establish care. When you choose a [facility] primary care physician or advanced practice provider, you gain a long-term partner in health. Find a Primary Care Provider  Learn more about [facility]'s in-office and virtual care options: [facility]

## 2022-01-07 NOTE — Progress Notes (Signed)
Virtual Visit Consent   Ashley Wallace, you are scheduled for a virtual visit with a [facility] provider today. Just as with appointments in the office, your consent must be obtained to participate. Your consent will be active for this visit and any virtual visit you may have with one of our providers in the next 365 days. If you have a MyChart account, a copy of this consent can be sent to you electronically.  As this is a virtual visit, video technology does not allow for your provider to perform a traditional examination. This may limit your provider's ability to fully assess your condition. If your provider identifies any concerns that need to be evaluated in person or the need to arrange testing (such as labs, EKG, etc.), we will make arrangements to do so. Although advances in technology are sophisticated, we cannot ensure that it will always work on either your end or our end. If the connection with a video visit is poor, the visit may have to be switched to a telephone visit. With either a video or telephone visit, we are not always able to ensure that we have a secure connection.  By engaging in this virtual visit, you consent to the provision of healthcare and authorize for your insurance to be billed (if applicable) for the services provided during this visit. Depending on your insurance coverage, you may receive a charge related to this service.  I need to obtain your verbal consent now. Are you willing to proceed with your visit today? Ashley Wallace has provided verbal consent on 01/07/2022 for a virtual visit (video or telephone). Ashley Loveless, PA-C  Date: 01/07/2022 10:32 AM  Virtual Visit via Video Note   I, Ashley Wallace, connected with  Ashley Wallace  ([id number], 11-20-1955) on 01/07/22 at 10:15 AM EST by a video-enabled telemedicine application and verified that I am speaking with the correct person using two identifiers.  Location: Patient: Virtual  Visit Location Patient: Home Provider: Virtual Visit Location Provider: Home Office   I discussed the limitations of evaluation and management by telemedicine and the availability of in person appointments. The patient expressed understanding and agreed to proceed.    History of Present Illness: Ashley Wallace is a 65 y.o. who identifies as a female who was assigned female at birth, and is being seen today for Covid 41.  HPI: URI  This is a new problem. The current episode started yesterday (Symptoms started Tuesday; tested positive for Covid 19 on at home test). The problem has been gradually worsening. Associated symptoms include congestion, coughing, headaches, rhinorrhea, sinus pain, sneezing and a sore throat. Pertinent negatives include no diarrhea, ear pain, nausea, plugged ear sensation or vomiting. Associated symptoms comments: Nose burning. Treatments tried: dayquil. The treatment provided no relief.     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   Allergic rhinitis 07/14/2009   Disorder of eustachian tube 03/26/2009   AD (atopic dermatitis) 03/26/2009   Chronic infection of sinus 12/27/2007   Insomnia 12/27/2007   Disturbance of skin sensation 08/23/2006   Multilevel degenerative disc disease 02/07/1998  Allergies:  Allergies  Allergen Reactions   Sulfamethoxazole-Trimethoprim    Medications:  Current Outpatient Medications:    benzonatate (TESSALON) 100 MG capsule, Take 1 capsule (100 mg total) by mouth 3 (three) times daily as needed., Disp: 30 capsule, Rfl: 0   molnupiravir EUA  (LAGEVRIO) 200 mg CAPS capsule, Take 4 capsules (800 mg total) by mouth 2 (two) times daily for 5 days., Disp: 40 capsule, Rfl: 0   promethazine-dextromethorphan (PROMETHAZINE-DM) 6.25-15 MG/5ML syrup, Take 5 mLs by mouth 4 (four) times daily as needed., Disp: 118 mL, Rfl: 0   famotidine (PEPCID) 20 MG tablet, Take 1 tablet (20 mg total) by mouth daily., Disp: 30 tablet, Rfl: 12   omeprazole (PRILOSEC) 20 MG capsule, TAKE 1 TO 2 CAPSULES (20-40 MG TOTAL) BY MOUTH DAILY, Disp: 30 capsule, Rfl: 5  Observations/Objective: Patient is well-developed, well-nourished in no acute distress.  Resting comfortably at home.  Head is normocephalic, atraumatic.  No labored breathing.  Speech is clear and coherent with logical content.  Patient is alert and oriented at baseline.    Assessment and Plan: 1. COVID-19 - molnupiravir EUA (LAGEVRIO) 200 mg CAPS capsule; Take 4 capsules (800 mg total) by mouth 2 (two) times daily for 5 days.  Dispense: 40 capsule; Refill: 0 - promethazine-dextromethorphan (PROMETHAZINE-DM) 6.25-15 MG/5ML syrup; Take 5 mLs by mouth 4 (four) times daily as needed.  Dispense: 118 mL; Refill: 0 - benzonatate (TESSALON) 100 MG capsule; Take 1 capsule (100 mg total) by mouth 3 (three) times daily as needed.  Dispense: 30 capsule; Refill: 0  - Continue OTC symptomatic management of choice - Will send OTC vitamins and supplement information through AVS - Molnupiravir prescribed - Promethazine DN and Tessalon perles prescribed for cough - Patient enrolled in MyChart symptom monitoring - Push fluids - Rest as needed - Discussed return precautions and when to seek in-person evaluation, sent via AVS as well   Follow Up Instructions: I discussed the assessment and treatment plan with the patient. The patient was provided an opportunity to ask questions and all were answered. The patient agreed with the plan and demonstrated an understanding of the instructions.  A copy of instructions  were sent to the patient via MyChart unless otherwise noted below.    The patient was advised to call back or seek an in-person evaluation if the symptoms worsen or if the condition fails to improve as anticipated.  Time:  I spent 14 minutes with the patient via telehealth technology discussing the above problems/concerns.    Ashley Loveless, PA-C

## 2022-01-17 ENCOUNTER — Encounter: Payer: Self-pay | Admitting: Family Medicine

## 2022-01-17 ENCOUNTER — Ambulatory Visit (INDEPENDENT_AMBULATORY_CARE_PROVIDER_SITE_OTHER): Payer: 59 | Admitting: Family Medicine

## 2022-01-17 VITALS — BP 135/95 | HR 109 | Resp 16 | Ht 62.0 in | Wt 153.0 lb

## 2022-01-17 DIAGNOSIS — E7800 Pure hypercholesterolemia, unspecified: Secondary | ICD-10-CM | POA: Diagnosis not present

## 2022-01-17 DIAGNOSIS — R7303 Prediabetes: Secondary | ICD-10-CM

## 2022-01-17 DIAGNOSIS — Z1231 Encounter for screening mammogram for malignant neoplasm of breast: Secondary | ICD-10-CM | POA: Diagnosis not present

## 2022-01-17 DIAGNOSIS — Z0000 Encounter for general adult medical examination without abnormal findings: Secondary | ICD-10-CM | POA: Diagnosis not present

## 2022-01-17 NOTE — Patient Instructions (Addendum)
Please review the attached list of medications and notify my office if there are any errors.   Please bring all of your medications to every appointment so we can make sure that our medication list is the same as yours.  Please call the [facility] at [facility] at [phone] to schedule your mammogram.  

## 2022-01-17 NOTE — Progress Notes (Signed)
I,April Miller,acting as a scribe for Mila Merry, MD.,have documented all relevant documentation on the behalf of Mila Merry, MD,as directed by  Mila Merry, MD while in the presence of Mila Merry, MD.   Complete physical exam   Patient: Ashley Wallace   DOB: 1955/08/09   66 y.o. Female  MRN: [id number] Visit Date: 01/17/2022  Today's healthcare provider: Mila Merry, MD   Chief Complaint  Patient presents with   Annual Exam   Subjective    Ashley Wallace is a 66 y.o. female who presents today for a complete physical exam.  She reports consuming a general diet. Home exercise routine includes walking 4-5 hrs per weeks. She generally feels well. She reports sleeping well. She does not have additional problems to discuss today.  HPI  Last colonoscopy: 09/30/2021. Last TDAP: 09/05/2012. Last PAP: 03/05/2018.  Past Medical History:  Diagnosis Date   Acid reflux 12/02/2014   Arthritis    Headache    Left thyroid nodule    Muscle spasms of neck 12/02/2014   Past Surgical History:  Procedure Laterality Date   CHOLECYSTECTOMY  1999   COLONOSCOPY WITH PROPOFOL N/A 09/30/2021   Procedure: COLONOSCOPY WITH PROPOFOL;  Surgeon: Wyline Mood, MD;  Location: [facility];  Service: Gastroenterology;  Laterality: N/A;   tosillectomy     TUBAL LIGATION     Social History   Socioeconomic History   Marital status: Widowed    Spouse name: Not on file   Number of children: Not on file   Years of education: Not on file   Highest education level: Not on file  Occupational History   Not on file  Tobacco Use   Smoking status: Never   Smokeless tobacco: Never  Vaping Use   Vaping Use: Never used  Substance and Sexual Activity   Alcohol use: No   Drug use: No   Sexual activity: Yes  Other Topics Concern   Not on file  Social History Narrative   Not on file   Social Determinants of Health   Financial Resource Strain: Not on file  Food Insecurity: Not on  file  Transportation Needs: Not on file  Physical Activity: Not on file  Stress: Not on file  Social Connections: Not on file  Intimate Partner Violence: Not on file   Family Status  Relation Name Status   Mother  Deceased at age 31       cause of death was Failure To Thrive   Father  Deceased       Cause of Death was cancer-had a brain tumor, lung cancer and Emphysema   Mat Aunt  (Not Specified)   Sister  Alive   Brother  Alive   Daughter  Alive   Son  Alive   Sister  Alive   Brother  Deceased at age 18       lymph   Brother  Alive   Brother  Deceased       work related accident   Brother  Deceased       shot   Brother  Alive   Family History  Problem Relation Age of Onset   Hypertension Mother    Diabetes Mother        non-Insulin dependent   Breast cancer Maternal Aunt    Diabetes Sister    Hypertension Sister    Hypertension Brother    Diabetes Sister    Hypertension Sister    Cancer Brother  Leukemia Brother    Cancer Brother        kidney   Hypertension Brother    Allergies  Allergen Reactions   Sulfamethoxazole-Trimethoprim     Patient Care Team: Malva Limes, MD as PCP - General (Family Medicine)   Medications: Outpatient Medications Prior to Visit  Medication Sig   famotidine (PEPCID) 20 MG tablet Take 1 tablet (20 mg total) by mouth daily.   [DISCONTINUED] benzonatate (TESSALON) 100 MG capsule Take 1 capsule (100 mg total) by mouth 3 (three) times daily as needed. (Patient not taking: Reported on 01/17/2022)   [DISCONTINUED] omeprazole (PRILOSEC) 20 MG capsule TAKE 1 TO 2 CAPSULES (20-40 MG TOTAL) BY MOUTH DAILY (Patient not taking: Reported on 01/17/2022)   [DISCONTINUED] promethazine-dextromethorphan (PROMETHAZINE-DM) 6.25-15 MG/5ML syrup Take 5 mLs by mouth 4 (four) times daily as needed. (Patient not taking: Reported on 01/17/2022)   No facility-administered medications prior to visit.    Review of Systems  Constitutional:  Negative  for chills, fatigue and fever.  HENT:  Positive for ear pain and sinus pressure. Negative for congestion, rhinorrhea, sneezing and sore throat.   Eyes: Negative.  Negative for pain and redness.  Respiratory:  Negative for cough, shortness of breath and wheezing.   Cardiovascular:  Negative for chest pain and leg swelling.  Gastrointestinal:  Negative for abdominal pain, blood in stool, constipation, diarrhea and nausea.  Endocrine: Negative for polydipsia and polyphagia.  Genitourinary: Negative.  Negative for dysuria, flank pain, hematuria, pelvic pain, vaginal bleeding and vaginal discharge.  Musculoskeletal:  Negative for arthralgias, back pain, gait problem and joint swelling.  Skin:  Negative for rash.  Neurological: Negative.  Negative for dizziness, tremors, seizures, weakness, light-headedness, numbness and headaches.  Hematological:  Negative for adenopathy.  Psychiatric/Behavioral: Negative.  Negative for behavioral problems, confusion and dysphoric mood. The patient is not nervous/anxious and is not hyperactive.       Objective    BP (!) 135/95 (BP Location: Right Arm, Patient Position: Sitting, Cuff Size: Normal)   Pulse (!) 109   Resp 16   Ht 5\' 2"  (1.575 m)   Wt 153 lb (69.4 kg)   SpO2 100%   BMI 27.98 kg/m     Physical Exam   General Appearance:    Well developed, well nourished female. Alert, cooperative, in no acute distress, appears stated age   Head:    Normocephalic, without obvious abnormality, atraumatic  Eyes:    PERRL, conjunctiva/corneas clear, EOM's intact, fundi    benign, both eyes  Ears:    Normal TM's and external ear canals, both ears  Nose:   Nares normal, septum midline, mucosa normal, no drainage    or sinus tenderness  Throat:   Lips, mucosa, and tongue normal; teeth and gums normal  Neck:   Supple, symmetrical, trachea midline, no adenopathy;    thyroid:  no enlargement/tenderness/nodules; no carotid   bruit or JVD  Back:     Symmetric, no  curvature, ROM normal, no CVA tenderness  Lungs:     Clear to auscultation bilaterally, respirations unlabored  Chest Wall:    No tenderness or deformity   Heart:    Tachycardic. Normal rhythm. No murmurs, rubs, or gallops.   Breast Exam:    normal appearance, no masses or tenderness, Inspection negative, No nipple discharge or bleeding, No axillary or supraclavicular adenopathy  Abdomen:     Soft, non-tender, bowel sounds active all four quadrants,    no masses, no organomegaly  Pelvic:  deferred  Extremities:   All extremities are intact. No cyanosis or edema  Pulses:   2+ and symmetric all extremities  Skin:   Skin color, texture, turgor normal, no rashes or lesions  Lymph nodes:   Cervical, supraclavicular, and axillary nodes normal  Neurologic:   CNII-XII intact, normal strength, sensation and reflexes    throughout     Last depression screening scores    01/17/2022    2:26 PM 08/20/2020    9:16 AM 03/09/2020    3:20 PM  PHQ 2/9 Scores  PHQ - 2 Score 0 0 0  PHQ- 9 Score 0  4   Last fall risk screening    01/17/2022    2:26 PM  Fall Risk   Falls in the past year? 0  Number falls in past yr: 0  Injury with Fall? 0  Risk for fall due to : No Fall Risks  Follow up Falls evaluation completed   Last Audit-C alcohol use screening    01/17/2022    2:26 PM  Alcohol Use Disorder Test (AUDIT)  1. How often do you have a drink containing alcohol? 0  2. How many drinks containing alcohol do you have on a typical day when you are drinking? 0  3. How often do you have six or more drinks on one occasion? 0  AUDIT-C Score 0   A score of 3 or more in women, and 4 or more in men indicates increased risk for alcohol abuse, EXCEPT if all of the points are from question 1   No results found for any visits on 01/17/22.  Assessment & Plan    Routine Health Maintenance and Physical Exam  Exercise Activities and Dietary recommendations  Goals   None     Immunization History   Administered Date(s) Administered   Tdap 09/05/2012    Health Maintenance  Topic Date Due   COVID-19 Vaccine (1) Never done   Zoster Vaccines- Shingrix (1 of 2) Never done   MAMMOGRAM  06/17/2021   INFLUENZA VACCINE  Never done   DTaP/Tdap/Td (2 - Td or Tdap) 09/06/2022   PAP SMEAR-Modifier  03/06/2023   COLONOSCOPY (Pts 45-1yrs Insurance coverage will need to be confirmed)  10/01/2031   Hepatitis C Screening  Completed   HPV VACCINES  Aged Out   HIV Screening  Discontinued    Discussed health benefits of physical activity, and encouraged her to engage in regular exercise appropriate for her age and condition.  She declined recommended flu and Shingles vaccines.   2. Pre-diabetes  - Hemoglobin A1c  3. Hypercholesterolemia Diet controlled - CBC - Comprehensive metabolic panel - Lipid panel - TSH  4. Breast cancer screening by mammogram  - MM 3D Screening Breast Bilateral - [facility]; Future     The entirety of the information documented in the History of Present Illness, Review of Systems and Physical Exam were personally obtained by me. Portions of this information were initially documented by the CMA and reviewed by me for thoroughness and accuracy.     Mila Merry, MD  [facility] [phone] (phone) [phone] (fax)  [facility]

## 2022-01-18 LAB — COMPREHENSIVE METABOLIC PANEL
ALT: 21 IU/L (ref 0–32)
AST: 23 IU/L (ref 0–40)
Albumin/Globulin Ratio: 1.7 (ref 1.2–2.2)
Albumin: 4.4 g/dL (ref 3.9–4.9)
Alkaline Phosphatase: 117 IU/L (ref 44–121)
BUN/Creatinine Ratio: 23 (ref 12–28)
BUN: 12 mg/dL (ref 8–27)
Bilirubin Total: 0.3 mg/dL (ref 0.0–1.2)
CO2: 24 mmol/L (ref 20–29)
Calcium: 10.2 mg/dL (ref 8.7–10.3)
Chloride: 104 mmol/L (ref 96–106)
Creatinine, Ser: 0.52 mg/dL — ABNORMAL LOW (ref 0.57–1.00)
Globulin, Total: 2.6 g/dL (ref 1.5–4.5)
Glucose: 95 mg/dL (ref 70–99)
Potassium: 4.5 mmol/L (ref 3.5–5.2)
Sodium: 142 mmol/L (ref 134–144)
Total Protein: 7 g/dL (ref 6.0–8.5)
eGFR: 104 mL/min/{1.73_m2} (ref >59)

## 2022-01-18 LAB — LIPID PANEL
Chol/HDL Ratio: 5.7 ratio — ABNORMAL HIGH (ref 0.0–4.4)
Cholesterol, Total: 243 mg/dL — ABNORMAL HIGH (ref 100–199)
HDL: 43 mg/dL (ref >39)
LDL Chol Calc (NIH): 108 mg/dL — ABNORMAL HIGH (ref 0–99)
Triglycerides: 536 mg/dL — ABNORMAL HIGH (ref 0–149)
VLDL Cholesterol Cal: 92 mg/dL — ABNORMAL HIGH (ref 5–40)

## 2022-01-18 LAB — CBC
Hematocrit: 46 % (ref 34.0–46.6)
Hemoglobin: 15.4 g/dL (ref 11.1–15.9)
MCH: 29.2 pg (ref 26.6–33.0)
MCHC: 33.5 g/dL (ref 31.5–35.7)
MCV: 87 fL (ref 79–97)
Platelets: 412 10*3/uL (ref 150–450)
RBC: 5.27 x10E6/uL (ref 3.77–5.28)
RDW: 12.4 % (ref 11.7–15.4)
WBC: 9.2 10*3/uL (ref 3.4–10.8)

## 2022-01-18 LAB — TSH: TSH: 0.648 u[IU]/mL (ref 0.450–4.500)

## 2022-01-18 LAB — HEMOGLOBIN A1C
Est. average glucose Bld gHb Est-mCnc: 117 mg/dL
Hgb A1c MFr Bld: 5.7 % — ABNORMAL HIGH (ref 4.8–5.6)

## 2022-02-23 ENCOUNTER — Ambulatory Visit
Admission: RE | Admit: 2022-02-23 | Discharge: 2022-02-23 | Disposition: A | Discharge status: Home / Self Care | Payer: 59 | Source: Ambulatory Visit | Attending: Family Medicine | Admitting: Family Medicine

## 2022-02-23 DIAGNOSIS — Z1231 Encounter for screening mammogram for malignant neoplasm of breast: Secondary | ICD-10-CM | POA: Insufficient documentation

## 2022-03-15 DIAGNOSIS — H5203 Hypermetropia, bilateral: Secondary | ICD-10-CM | POA: Diagnosis not present

## 2022-03-15 DIAGNOSIS — H01001 Unspecified blepharitis right upper eyelid: Secondary | ICD-10-CM | POA: Diagnosis not present

## 2022-03-15 DIAGNOSIS — E119 Type 2 diabetes mellitus without complications: Secondary | ICD-10-CM | POA: Diagnosis not present

## 2022-03-15 DIAGNOSIS — H01005 Unspecified blepharitis left lower eyelid: Secondary | ICD-10-CM | POA: Diagnosis not present

## 2022-04-15 DIAGNOSIS — M25431 Effusion, right wrist: Secondary | ICD-10-CM | POA: Diagnosis not present

## 2022-06-20 ENCOUNTER — Ambulatory Visit: Payer: 59 | Admitting: Family Medicine

## 2022-07-25 ENCOUNTER — Encounter: Payer: Self-pay | Admitting: Podiatry

## 2022-07-25 ENCOUNTER — Ambulatory Visit (INDEPENDENT_AMBULATORY_CARE_PROVIDER_SITE_OTHER): Payer: Medicare HMO

## 2022-07-25 ENCOUNTER — Ambulatory Visit: Payer: Medicare HMO | Admitting: Podiatry

## 2022-07-25 DIAGNOSIS — M7752 Other enthesopathy of left foot: Secondary | ICD-10-CM | POA: Diagnosis not present

## 2022-07-25 DIAGNOSIS — D2371 Other benign neoplasm of skin of right lower limb, including hip: Secondary | ICD-10-CM | POA: Diagnosis not present

## 2022-07-25 DIAGNOSIS — D2372 Other benign neoplasm of skin of left lower limb, including hip: Secondary | ICD-10-CM | POA: Diagnosis not present

## 2022-07-25 DIAGNOSIS — M778 Other enthesopathies, not elsewhere classified: Secondary | ICD-10-CM

## 2022-07-25 DIAGNOSIS — M7751 Other enthesopathy of right foot: Secondary | ICD-10-CM | POA: Diagnosis not present

## 2022-07-25 MED ORDER — TRIAMCINOLONE ACETONIDE 40 MG/ML IJ SUSP
40.0000 mg | Freq: Once | INTRAMUSCULAR | Status: AC
  Administered 2022-07-25: 40 mg

## 2022-07-25 NOTE — Progress Notes (Signed)
She presents today complaining of pain to her plantar and dorsal aspect of her bilateral feet states that the right wound appears to be the worst she also has callus beneath the fifth metatarsal right primarily.  Denies any changes past medical history medications allergies states that she is doing a lot more babysitting and on her feet a lot more and wearing tennis shoes the majority of the time.  Objective: Reviewed her past medical history medications allergies surgeries and social history.  Pulses are palpable.  Neurologic sensorium is intact Deetjen reflexes are intact muscle strength is normal and symmetrical.  She has hallux valgus deformity right greater than left with limited range of motion and pain on range of motion of the first metatarsophalangeal joint of the right foot.  Mild hammertoe deformities #2 bilaterally.  Also has pain on end range of motion of the second metatarsal phalangeal joint.  She does have a benign skin lesion subfifth metatarsal head of the right foot as opposed to the left no palpable bursa associated with this.  Assessment: Benign skin lesion subfifth bilateral.  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  Capsulitis of the second metatarsophalangeal joint bilateral.  Hammertoe deformity second bilateral.  Plan: Discussed etiology pathology conservative versus surgical therapies.  I injected the second metatarsal phalangeal joint area today with 10 mg Kenalog 5 mg Marcaine point of maximal tenderness.  Debrided the benign skin lesion discussed appropriate shoe gear stretching exercise ice therapy.  Discussed the possible need for surgical intervention regarding the hallux limitus and the osteoarthritis.

## 2022-07-28 DIAGNOSIS — M67431 Ganglion, right wrist: Secondary | ICD-10-CM | POA: Diagnosis not present

## 2022-08-22 DIAGNOSIS — G8918 Other acute postprocedural pain: Secondary | ICD-10-CM | POA: Diagnosis not present

## 2022-08-22 DIAGNOSIS — M67431 Ganglion, right wrist: Secondary | ICD-10-CM | POA: Diagnosis not present

## 2022-09-06 DIAGNOSIS — Z4789 Encounter for other orthopedic aftercare: Secondary | ICD-10-CM | POA: Diagnosis not present

## 2022-10-08 DIAGNOSIS — Z008 Encounter for other general examination: Secondary | ICD-10-CM | POA: Diagnosis not present

## 2022-10-08 DIAGNOSIS — Z882 Allergy status to sulfonamides status: Secondary | ICD-10-CM | POA: Diagnosis not present

## 2022-10-08 DIAGNOSIS — Z833 Family history of diabetes mellitus: Secondary | ICD-10-CM | POA: Diagnosis not present

## 2022-10-08 DIAGNOSIS — Z791 Long term (current) use of non-steroidal anti-inflammatories (NSAID): Secondary | ICD-10-CM | POA: Diagnosis not present

## 2022-10-08 DIAGNOSIS — Z809 Family history of malignant neoplasm, unspecified: Secondary | ICD-10-CM | POA: Diagnosis not present

## 2022-10-27 ENCOUNTER — Encounter: Payer: Self-pay | Admitting: Internal Medicine

## 2022-10-27 ENCOUNTER — Ambulatory Visit (INDEPENDENT_AMBULATORY_CARE_PROVIDER_SITE_OTHER): Payer: Medicare HMO | Admitting: Internal Medicine

## 2022-10-27 VITALS — BP 132/86 | HR 104 | Temp 96.4°F | Wt 148.0 lb

## 2022-10-27 DIAGNOSIS — R7303 Prediabetes: Secondary | ICD-10-CM

## 2022-10-27 DIAGNOSIS — F5101 Primary insomnia: Secondary | ICD-10-CM | POA: Diagnosis not present

## 2022-10-27 DIAGNOSIS — E663 Overweight: Secondary | ICD-10-CM

## 2022-10-27 DIAGNOSIS — Z6827 Body mass index (BMI) 27.0-27.9, adult: Secondary | ICD-10-CM

## 2022-10-27 DIAGNOSIS — E7800 Pure hypercholesterolemia, unspecified: Secondary | ICD-10-CM | POA: Diagnosis not present

## 2022-10-27 DIAGNOSIS — K219 Gastro-esophageal reflux disease without esophagitis: Secondary | ICD-10-CM | POA: Diagnosis not present

## 2022-10-27 DIAGNOSIS — G5603 Carpal tunnel syndrome, bilateral upper limbs: Secondary | ICD-10-CM

## 2022-10-27 MED ORDER — OMEPRAZOLE 20 MG PO CPDR: 20.0000 mg | DELAYED_RELEASE_CAPSULE | Freq: Every day | ORAL | 1 refills | Status: DC

## 2022-10-27 NOTE — Assessment & Plan Note (Signed)
Okay to continue Benadryl or ZzzQuil OTC as needed

## 2022-10-27 NOTE — Assessment & Plan Note (Signed)
Status post surgical intervention.

## 2022-10-27 NOTE — Patient Instructions (Signed)

## 2022-10-27 NOTE — Assessment & Plan Note (Signed)
Encouraged diet and exercise for weight loss ?

## 2022-10-27 NOTE — Assessment & Plan Note (Signed)
Will discontinue famotidine due to ineffectiveness and will start omeprazole 20 mg daily Try to avoid foods that trigger reflux Encourage weight loss as this can help reduce reflux symptoms

## 2022-10-27 NOTE — Assessment & Plan Note (Signed)
Will check A1c at annual exam Encouraged low-carb diet and exercise for weight loss

## 2022-10-27 NOTE — Assessment & Plan Note (Signed)
Will check lipid profile annual exam Encouraged low-fat diet

## 2022-10-27 NOTE — Progress Notes (Signed)
HPI  Patient presents to clinic today to establish care and for management of the conditions listed below.  GERD: Triggered by coffee, eggs.  She is no longer taking famotidine because it did not help. She would lie to go back on omeprazole.  There is no upper GI on file.  Prediabetes: Her last A1c was 5.7%, 01/2022.  She is not taking any oral diabetic medication at this time.  She does not check her sugars.  HLD: Her last LDL was 108, triglycerides 536, 01/2022.  She is not taking any cholesterol-lowering medication at this time.  She does not tries to consume a low fat diet.  Insomnia: She has difficulty falling and staying asleep.  She takes benadryl or zyquil with some relief of symptoms.  There is no sleep study on file.  Right carpal tunnel: s/p surgical intervention. She is not taking any medications for this.  She does not follow with neurology.  Past Medical History:  Diagnosis Date   Acid reflux 12/02/2014   Arthritis    Headache    Left thyroid nodule    Muscle spasms of neck 12/02/2014    Current Outpatient Medications  Medication Sig Dispense Refill   famotidine (PEPCID) 20 MG tablet Take 1 tablet (20 mg total) by mouth daily. 30 tablet 12   No current facility-administered medications for this visit.    Allergies  Allergen Reactions   Sulfamethoxazole-Trimethoprim     Family History  Problem Relation Age of Onset   Hypertension Mother    Diabetes Mother        non-Insulin dependent   Diabetes Sister    Hypertension Sister    Diabetes Sister    Hypertension Sister    Breast cancer Maternal Aunt    Breast cancer Cousin    Breast cancer Cousin    Hypertension Brother    Cancer Brother    Leukemia Brother    Cancer Brother        kidney   Hypertension Brother     Social History   Socioeconomic History   Marital status: Widowed    Spouse name: Not on file   Number of children: Not on file   Years of education: Not on file   Highest education  level: Not on file  Occupational History   Not on file  Tobacco Use   Smoking status: Never   Smokeless tobacco: Never  Vaping Use   Vaping status: Never Used  Substance and Sexual Activity   Alcohol use: No   Drug use: No   Sexual activity: Yes  Other Topics Concern   Not on file  Social History Narrative   Not on file   Social Determinants of Health   Financial Resource Strain: Not on file  Food Insecurity: Not on file  Transportation Needs: Not on file  Physical Activity: Not on file  Stress: Not on file  Social Connections: Not on file  Intimate Partner Violence: Not on file    ROS:  Constitutional: Denies fever, malaise, fatigue, headache or abrupt weight changes.  HEENT: Denies eye pain, eye redness, ear pain, ringing in the ears, wax buildup, runny nose, nasal congestion, bloody nose, or sore throat. Respiratory: Denies difficulty breathing, shortness of breath, cough or sputum production.   Cardiovascular: Denies chest pain, chest tightness, palpitations or swelling in the hands or feet.  Gastrointestinal: Patient reports reflux.  Denies abdominal pain, bloating, constipation, diarrhea or blood in the stool.  GU: Denies frequency, urgency, pain with urination,  blood in urine, odor or discharge. Musculoskeletal: Denies decrease in range of motion, difficulty with gait, muscle pain or joint pain and swelling.  Skin: Denies redness, rashes, lesions or ulcercations.  Neurological: Patient reports insomnia.  Denies dizziness, difficulty with memory, difficulty with speech or problems with balance and coordination.  Psych: Denies anxiety, depression, SI/HI.  No other specific complaints in a complete review of systems (except as listed in HPI above).  PE:  BP 132/86 (BP Location: Left Arm, Patient Position: Sitting, Cuff Size: Normal)   Pulse (!) 104   Temp (!) 96.4 F (35.8 C) (Temporal)   Wt 148 lb (67.1 kg)   SpO2 96%   BMI 27.07 kg/m   Wt Readings from Last  3 Encounters:  01/17/22 153 lb (69.4 kg)  09/30/21 150 lb (68 kg)  07/02/21 154 lb (69.9 kg)    General: Appears her overweight stated age, overweight, in NAD. HEENT: Head: normal shape and size; Eyes: sclera white, no icterus, conjunctiva pink, PERRLA and EOMs intact;  Cardiovascular: Normal rate and rhythm. S1,S2 noted.  No murmur, rubs or gallops noted. No JVD or BLE edema. No carotid bruits noted. Pulmonary/Chest: Normal effort and positive vesicular breath sounds. No respiratory distress. No wheezes, rales or ronchi noted.  Abdomen: Normal bowel sounds Musculoskeletal: No difficulty with gait.  Neurological: Alert and oriented.  Coordination normal.  Psychiatric: Mood and affect normal. Behavior is normal. Judgment and thought content normal.    BMET    Component Value Date/Time   NA 142 01/17/2022 1506   K 4.5 01/17/2022 1506   CL 104 01/17/2022 1506   CO2 24 01/17/2022 1506   GLUCOSE 95 01/17/2022 1506   GLUCOSE 127 (H) 02/18/2018 0524   BUN 12 01/17/2022 1506   CREATININE 0.52 (L) 01/17/2022 1506   CREATININE 0.75 12/16/2016 1111   CALCIUM 10.2 01/17/2022 1506   GFRNONAA 94 05/20/2019 1504   GFRNONAA 87 12/16/2016 1111   GFRAA 108 05/20/2019 1504   GFRAA 101 12/16/2016 1111    Lipid Panel     Component Value Date/Time   CHOL 243 (H) 01/17/2022 1506   TRIG 536 (H) 01/17/2022 1506   HDL 43 01/17/2022 1506   CHOLHDL 5.7 (H) 01/17/2022 1506   CHOLHDL 3.2 12/16/2016 1111   LDLCALC 108 (H) 01/17/2022 1506   LDLCALC 109 (H) 12/16/2016 1111    CBC    Component Value Date/Time   WBC 9.2 01/17/2022 1506   WBC 15.6 (H) 02/18/2018 0524   RBC 5.27 01/17/2022 1506   RBC 5.02 02/18/2018 0524   HGB 15.4 01/17/2022 1506   HCT 46.0 01/17/2022 1506   PLT 412 01/17/2022 1506   MCV 87 01/17/2022 1506   MCH 29.2 01/17/2022 1506   MCH 28.7 02/18/2018 0524   MCHC 33.5 01/17/2022 1506   MCHC 32.4 02/18/2018 0524   RDW 12.4 01/17/2022 1506   LYMPHSABS 1.9 08/20/2020 1140    MONOABS 1.1 (H) 02/18/2018 0524   EOSABS 0.3 08/20/2020 1140   BASOSABS 0.1 08/20/2020 1140    Hgb A1C Lab Results  Component Value Date   HGBA1C 5.7 (H) 01/17/2022     Assessment and Plan:   RTC in 3 months for your annual exam Nicki Reaper, NP

## 2022-11-01 DIAGNOSIS — M67431 Ganglion, right wrist: Secondary | ICD-10-CM | POA: Diagnosis not present

## 2022-11-19 DIAGNOSIS — U071 COVID-19: Secondary | ICD-10-CM | POA: Diagnosis not present

## 2022-11-19 DIAGNOSIS — R059 Cough, unspecified: Secondary | ICD-10-CM | POA: Diagnosis not present

## 2022-11-19 DIAGNOSIS — R062 Wheezing: Secondary | ICD-10-CM | POA: Diagnosis not present

## 2022-12-01 DIAGNOSIS — U071 COVID-19: Secondary | ICD-10-CM | POA: Diagnosis not present

## 2022-12-01 DIAGNOSIS — R051 Acute cough: Secondary | ICD-10-CM | POA: Diagnosis not present

## 2022-12-30 ENCOUNTER — Ambulatory Visit
Admission: RE | Admit: 2022-12-30 | Discharge: 2022-12-30 | Disposition: A | Discharge status: Home / Self Care | Payer: Medicare HMO | Source: Ambulatory Visit | Attending: Pulmonary Disease | Admitting: Pulmonary Disease

## 2022-12-30 ENCOUNTER — Encounter: Payer: Self-pay | Admitting: Pulmonary Disease

## 2022-12-30 ENCOUNTER — Ambulatory Visit: Payer: Medicare HMO | Admitting: Pulmonary Disease

## 2022-12-30 VITALS — BP 132/80 | HR 88 | Temp 97.6°F | Ht 62.0 in | Wt 150.0 lb

## 2022-12-30 DIAGNOSIS — J4540 Moderate persistent asthma, uncomplicated: Secondary | ICD-10-CM | POA: Diagnosis not present

## 2022-12-30 DIAGNOSIS — R059 Cough, unspecified: Secondary | ICD-10-CM | POA: Diagnosis not present

## 2022-12-30 DIAGNOSIS — K219 Gastro-esophageal reflux disease without esophagitis: Secondary | ICD-10-CM | POA: Diagnosis not present

## 2022-12-30 DIAGNOSIS — R0602 Shortness of breath: Secondary | ICD-10-CM

## 2022-12-30 LAB — NITRIC OXIDE: Nitric Oxide: 62

## 2022-12-30 MED ORDER — BUDESONIDE-FORMOTEROL FUMARATE 160-4.5 MCG/ACT IN AERO: 2.0000 | INHALATION_SPRAY | Freq: Two times a day (BID) | RESPIRATORY_TRACT | 12 refills | Status: DC

## 2022-12-30 NOTE — Progress Notes (Signed)
Synopsis: Referred in by Lorre Munroe, NP   Subjective:   PATIENT ID: Ashley Wallace GENDER: female DOB: 1955/02/26, MRN: [id number]  Chief Complaint  Patient presents with   Consult    Dry cough on and off since November after having COVID in 2023. Worse since last month after having COVID again. Shortness of breath on exertion and occasional at rest.     HPI [name] is a 67 year old female patient with a past medical history of GERD presenting today to the pulmonary clinic for ongoing cough and shortness of breath on exertion.  She reports that she contracted COVID-19 last year and afterwards she started having intermittent cough dry throughout the day.  She contracted COVID-19 again in October 2024 and afterwards her cough has worsened along with shortness of breath on exertion.  She denies wheezing or chest tightness.  She denies a diagnosis of asthma.  She was never hospitalized for breathing issues.  She was prescribed prednisone as outpatient as well as albuterol as needed which helped a little.  She does have GERD and takes over-the-counter Prilosec.  Family history -father with lung cancer and emphysema was a smoker.  Social history -never smoker denies illicit drug use lives at home by herself.  Has 2 kids who are healthy.  ROS All systems were reviewed and are negative except for the above Objective:   Vitals:   12/30/22 1013  BP: 132/80  Pulse: 88  Temp: 97.6 F (36.4 C)  TempSrc: Temporal  SpO2: 97%  Weight: 150 lb (68 kg)  Height: 5\' 2"  (1.575 m)   97% on RA BMI Readings from Last 3 Encounters:  12/30/22 27.44 kg/m  10/27/22 27.07 kg/m  01/17/22 27.98 kg/m   Wt Readings from Last 3 Encounters:  12/30/22 150 lb (68 kg)  10/27/22 148 lb (67.1 kg)  01/17/22 153 lb (69.4 kg)    Physical Exam GEN: NAD, Healthy Appearing HEENT: Supple Neck, Reactive Pupils, EOMI  CVS: Normal S1, Normal S2, RRR, No murmurs or ES appreciated  Lungs:  Clear bilateral air entry.  Abdomen: Soft, non tender, non distended, + BS  Extremities: Warm and well perfused, No edema  Skin: No suspicious lesions appreciated  Psych: Normal Affect  Labs and imaging were reviewed. Ancillary Information   CBC    Component Value Date/Time   WBC 9.2 01/17/2022 1506   WBC 15.6 (H) 02/18/2018 0524   RBC 5.27 01/17/2022 1506   RBC 5.02 02/18/2018 0524   HGB 15.4 01/17/2022 1506   HCT 46.0 01/17/2022 1506   PLT 412 01/17/2022 1506   MCV 87 01/17/2022 1506   MCH 29.2 01/17/2022 1506   MCH 28.7 02/18/2018 0524   MCHC 33.5 01/17/2022 1506   MCHC 32.4 02/18/2018 0524   RDW 12.4 01/17/2022 1506   LYMPHSABS 1.9 08/20/2020 1140   MONOABS 1.1 (H) 02/18/2018 0524   EOSABS 0.3 08/20/2020 1140   BASOSABS 0.1 08/20/2020 1140        No data to display           Assessment & Plan:  [name] is a 67 year old female patient with a past medical history of GERD presenting today to the pulmonary clinic for ongoing cough and shortness of breath on exertion.  # Post-COVID reactive airway disease/adult onset asthma [address] [address]  []  PFTs []  Chest x-ray []  Start budesonide-formoterol [Symbicort] 2 puffs twice a day.  Advised mouth rinsing afterwards. []  Continue albuterol as needed.  # GERD Continue Prilosec 20  mg daily. Return in about 3 months (around 04/01/2023).  I spent 60 minutes caring for this patient today, including preparing to see the patient, obtaining a medical history , reviewing a separately obtained history, performing a medically appropriate examination and/or evaluation, counseling and educating the patient/family/caregiver, ordering medications, tests, or procedures, documenting clinical information in the electronic health record, and independently interpreting results (not separately reported/billed) and communicating results to the patient/family/caregiver  Janann Colonel, MD [facility] 12/30/2022 10:54 AM

## 2023-01-02 ENCOUNTER — Encounter: Payer: Self-pay | Admitting: Pulmonary Disease

## 2023-01-09 ENCOUNTER — Telehealth: Payer: Self-pay | Admitting: Pulmonary Disease

## 2023-01-09 MED ORDER — ARNUITY ELLIPTA 200 MCG/ACT IN AEPB: 1.0000 | INHALATION_SPRAY | Freq: Every day | RESPIRATORY_TRACT | 6 refills | Status: DC

## 2023-01-09 NOTE — Telephone Encounter (Signed)
Patient advised of new prescription. Nothing further needed.

## 2023-01-09 NOTE — Telephone Encounter (Signed)
[employer] for Dr. Larinda Buttery.  Patient diagnosed with asthma after initial visit with Dr. Larinda Buttery on 11/22.  Given issues with Symbicort by Arnuity 200 mcg, 1 inhalation daily or Qvar 80 mcg 2 puffs twice a day.  Depending on what insurance covers.  Sure patient has albuterol rescue inhaler.

## 2023-01-09 NOTE — Telephone Encounter (Signed)
Patient states having reaction to Symbicort. States having symptoms of dizzy, headache, and high blood pressure. Pharmacy is [facility]. [place]. Patient phone number is [phone].

## 2023-01-18 DIAGNOSIS — J45909 Unspecified asthma, uncomplicated: Secondary | ICD-10-CM | POA: Diagnosis not present

## 2023-01-18 DIAGNOSIS — R519 Headache, unspecified: Secondary | ICD-10-CM | POA: Diagnosis not present

## 2023-01-18 DIAGNOSIS — R0981 Nasal congestion: Secondary | ICD-10-CM | POA: Diagnosis not present

## 2023-01-18 DIAGNOSIS — Z7951 Long term (current) use of inhaled steroids: Secondary | ICD-10-CM | POA: Diagnosis not present

## 2023-01-18 DIAGNOSIS — I10 Essential (primary) hypertension: Secondary | ICD-10-CM | POA: Diagnosis not present

## 2023-01-18 DIAGNOSIS — R079 Chest pain, unspecified: Secondary | ICD-10-CM | POA: Diagnosis not present

## 2023-01-18 DIAGNOSIS — Z882 Allergy status to sulfonamides status: Secondary | ICD-10-CM | POA: Diagnosis not present

## 2023-01-27 ENCOUNTER — Ambulatory Visit (INDEPENDENT_AMBULATORY_CARE_PROVIDER_SITE_OTHER): Payer: Medicare HMO | Admitting: Internal Medicine

## 2023-01-27 ENCOUNTER — Encounter: Payer: Self-pay | Admitting: Internal Medicine

## 2023-01-27 VITALS — BP 160/94 | HR 97 | Ht 62.0 in | Wt 148.0 lb

## 2023-01-27 DIAGNOSIS — E663 Overweight: Secondary | ICD-10-CM | POA: Diagnosis not present

## 2023-01-27 DIAGNOSIS — I10 Essential (primary) hypertension: Secondary | ICD-10-CM

## 2023-01-27 DIAGNOSIS — Z6827 Body mass index (BMI) 27.0-27.9, adult: Secondary | ICD-10-CM

## 2023-01-27 DIAGNOSIS — R7303 Prediabetes: Secondary | ICD-10-CM | POA: Diagnosis not present

## 2023-01-27 DIAGNOSIS — Z0001 Encounter for general adult medical examination with abnormal findings: Secondary | ICD-10-CM

## 2023-01-27 DIAGNOSIS — E7800 Pure hypercholesterolemia, unspecified: Secondary | ICD-10-CM

## 2023-01-27 DIAGNOSIS — Z780 Asymptomatic menopausal state: Secondary | ICD-10-CM

## 2023-01-27 DIAGNOSIS — Z1231 Encounter for screening mammogram for malignant neoplasm of breast: Secondary | ICD-10-CM | POA: Diagnosis not present

## 2023-01-27 HISTORY — DX: Essential (primary) hypertension: I10

## 2023-01-27 MED ORDER — OLMESARTAN MEDOXOMIL 20 MG PO TABS: 20.0000 mg | ORAL_TABLET | Freq: Every day | ORAL | 0 refills | Status: DC

## 2023-01-27 NOTE — Assessment & Plan Note (Signed)
Encouraged diet and exercise for weight loss ?

## 2023-01-27 NOTE — Patient Instructions (Signed)
Health Maintenance for Postmenopausal Women Menopause is a normal process in which your ability to get pregnant comes to an end. This process happens slowly over many months or years, usually between the ages of 48 and 55. Menopause is complete when you have missed your menstrual period for 12 months. It is important to talk with your health care provider about some of the most common conditions that affect women after menopause (postmenopausal women). These include heart disease, cancer, and bone loss (osteoporosis). Adopting a healthy lifestyle and getting preventive care can help to promote your health and wellness. The actions you take can also lower your chances of developing some of these common conditions. What are the signs and symptoms of menopause? During menopause, you may have the following symptoms: Hot flashes. These can be moderate or severe. Night sweats. Decrease in sex drive. Mood swings. Headaches. Tiredness (fatigue). Irritability. Memory problems. Problems falling asleep or staying asleep. Talk with your health care provider about treatment options for your symptoms. Do I need hormone replacement therapy? Hormone replacement therapy is effective in treating symptoms that are caused by menopause, such as hot flashes and night sweats. Hormone replacement carries certain risks, especially as you become older. If you are thinking about using estrogen or estrogen with progestin, discuss the benefits and risks with your health care provider. How can I reduce my risk for heart disease and stroke? The risk of heart disease, heart attack, and stroke increases as you age. One of the causes may be a change in the body's hormones during menopause. This can affect how your body uses dietary fats, triglycerides, and cholesterol. Heart attack and stroke are medical emergencies. There are many things that you can do to help prevent heart disease and stroke. Watch your blood pressure High  blood pressure causes heart disease and increases the risk of stroke. This is more likely to develop in people who have high blood pressure readings or are overweight. Have your blood pressure checked: Every 3-5 years if you are 18-39 years of age. Every year if you are 40 years old or older. Eat a healthy diet  Eat a diet that includes plenty of vegetables, fruits, low-fat dairy products, and lean protein. Do not eat a lot of foods that are high in solid fats, added sugars, or sodium. Get regular exercise Get regular exercise. This is one of the most important things you can do for your health. Most adults should: Try to exercise for at least 150 minutes each week. The exercise should increase your heart rate and make you sweat (moderate-intensity exercise). Try to do strengthening exercises at least twice each week. Do these in addition to the moderate-intensity exercise. Spend less time sitting. Even light physical activity can be beneficial. Other tips Work with your health care provider to achieve or maintain a healthy weight. Do not use any products that contain nicotine or tobacco. These products include cigarettes, chewing tobacco, and vaping devices, such as e-cigarettes. If you need help quitting, ask your health care provider. Know your numbers. Ask your health care provider to check your cholesterol and your blood sugar (glucose). Continue to have your blood tested as directed by your health care provider. Do I need screening for cancer? Depending on your health history and family history, you may need to have cancer screenings at different stages of your life. This may include screening for: Breast cancer. Cervical cancer. Lung cancer. Colorectal cancer. What is my risk for osteoporosis? After menopause, you may be   at increased risk for osteoporosis. Osteoporosis is a condition in which bone destruction happens more quickly than new bone creation. To help prevent osteoporosis or  the bone fractures that can happen because of osteoporosis, you may take the following actions: If you are 19-50 years old, get at least 1,000 mg of calcium and at least 600 international units (IU) of vitamin D per day. If you are older than age 50 but younger than age 70, get at least 1,200 mg of calcium and at least 600 international units (IU) of vitamin D per day. If you are older than age 70, get at least 1,200 mg of calcium and at least 800 international units (IU) of vitamin D per day. Smoking and drinking excessive alcohol increase the risk of osteoporosis. Eat foods that are rich in calcium and vitamin D, and do weight-bearing exercises several times each week as directed by your health care provider. How does menopause affect my mental health? Depression may occur at any age, but it is more common as you become older. Common symptoms of depression include: Feeling depressed. Changes in sleep patterns. Changes in appetite or eating patterns. Feeling an overall lack of motivation or enjoyment of activities that you previously enjoyed. Frequent crying spells. Talk with your health care provider if you think that you are experiencing any of these symptoms. General instructions See your health care provider for regular wellness exams and vaccines. This may include: Scheduling regular health, dental, and eye exams. Getting and maintaining your vaccines. These include: Influenza vaccine. Get this vaccine each year before the flu season begins. Pneumonia vaccine. Shingles vaccine. Tetanus, diphtheria, and pertussis (Tdap) booster vaccine. Your health care provider may also recommend other immunizations. Tell your health care provider if you have ever been abused or do not feel safe at home. Summary Menopause is a normal process in which your ability to get pregnant comes to an end. This condition causes hot flashes, night sweats, decreased interest in sex, mood swings, headaches, or lack  of sleep. Treatment for this condition may include hormone replacement therapy. Take actions to keep yourself healthy, including exercising regularly, eating a healthy diet, watching your weight, and checking your blood pressure and blood sugar levels. Get screened for cancer and depression. Make sure that you are up to date with all your vaccines. This information is not intended to replace advice given to you by your health care provider. Make sure you discuss any questions you have with your health care provider. Document Revised: 06/15/2020 Document Reviewed: 06/15/2020 Elsevier Patient Education  2024 [employer]vier Inc.  

## 2023-01-27 NOTE — Assessment & Plan Note (Signed)
Will start olmesartan 20 mg daily Reinforced DASH diet and exercise for weight loss CMET today

## 2023-01-27 NOTE — Progress Notes (Signed)
Subjective:    Patient ID: Ashley Wallace, female    DOB: 1955-03-30, 67 y.o.   MRN: [id number]  HPI  Patient presents to clinic today for her annual exam. Of note, her BP today is 158/90. She has been having elevated blood pressures in the past. She is not taking any antihypertensive medications at this time.   Flu: never Tetanus: 08/2012 COVID: never Pneumovax: never Prevnar: never Shingrix: never Pap smear: 02/2018 Mammogram: 02/2022 Bone density: never Colon screening: 09/2021 Vision screening: annually Dentist: biannually  Diet: She does eat meat. She consumes fruits and veggies. She tries to avoid fried foods. She drinks mostly water. Exercise: Walking   Review of Systems   Past Medical History:  Diagnosis Date   Acid reflux 12/02/2014   Arthritis    Headache    Left thyroid nodule    Muscle spasms of neck 12/02/2014    Current Outpatient Medications  Medication Sig Dispense Refill   albuterol (VENTOLIN HFA) 108 (90 Base) MCG/ACT inhaler Inhale 1-2 puffs into the lungs every 4 (four) hours as needed for shortness of breath.     benzonatate (TESSALON) 200 MG capsule Take 200 mg by mouth 2 (two) times daily as needed for cough.     Fluticasone Furoate (ARNUITY ELLIPTA) 200 MCG/ACT AEPB Inhale 1 puff into the lungs daily in the afternoon. 30 each 6   omeprazole (PRILOSEC) 20 MG capsule Take 1 capsule (20 mg total) by mouth daily. 90 capsule 1   promethazine-dextromethorphan (PROMETHAZINE-DM) 6.25-15 MG/5ML syrup Take 5 mLs by mouth 4 (four) times daily as needed for cough.     No current facility-administered medications for this visit.    Allergies  Allergen Reactions   Sulfamethoxazole-Trimethoprim     Family History  Problem Relation Age of Onset   Hypertension Mother    Diabetes Mother        non-Insulin dependent   Diabetes Sister    Hypertension Sister    Diabetes Sister    Hypertension Sister    Hypertension Brother    Leukemia Brother     Cancer Brother        kidney   Hypertension Brother    Breast cancer Maternal Aunt    Breast cancer Cousin    Breast cancer Cousin     Social History   Socioeconomic History   Marital status: Widowed    Spouse name: Not on file   Number of children: Not on file   Years of education: Not on file   Highest education level: 12th grade  Occupational History   Not on file  Tobacco Use   Smoking status: Never   Smokeless tobacco: Never  Vaping Use   Vaping status: Never Used  Substance and Sexual Activity   Alcohol use: No   Drug use: No   Sexual activity: Yes  Other Topics Concern   Not on file  Social History Narrative   Not on file   Social Drivers of Health   Financial Resource Strain: Low Risk  (01/23/2023)   Overall Financial Resource Strain (CARDIA)    Difficulty of Paying Living Expenses: Not hard at all  Food Insecurity: No Food Insecurity (01/23/2023)   Hunger Vital Sign    Worried About Running Out of Food in the Last Year: Never true    Ran Out of Food in the Last Year: Never true  Transportation Needs: No Transportation Needs (01/23/2023)   PRAPARE - Administrator, Civil Service (Medical): No  Lack of Transportation (Non-Medical): No  Physical Activity: Sufficiently Active (01/23/2023)   Exercise Vital Sign    Days of Exercise per Week: 5 days    Minutes of Exercise per Session: 30 min  Stress: No Stress Concern Present (01/23/2023)   [employer] of Occupational Health - Occupational Stress Questionnaire    Feeling of Stress : Only a little  Social Connections: Unknown (01/23/2023)   Social Connection and Isolation Panel [NHANES]    Frequency of Communication with Friends and Family: More than three times a week    Frequency of Social Gatherings with Friends and Family: More than three times a week    Attends Religious Services: Patient declined    Database administrator or Organizations: No    Attends Banker Meetings:  Not on file    Marital Status: Widowed  Intimate Partner Violence: Not on file     Constitutional: Pt reports intermittent headaches. Denies fever, malaise, fatigue, or abrupt weight changes.  HEENT: Denies eye pain, eye redness, ear pain, ringing in the ears, wax buildup, runny nose, nasal congestion, bloody nose, or sore throat. Respiratory: Denies difficulty breathing, shortness of breath, cough or sputum production.   Cardiovascular: Denies chest pain, chest tightness, palpitations or swelling in the hands or feet.  Gastrointestinal: Pt reports constipation. Denies abdominal pain, bloating, diarrhea or blood in the stool.  GU: Denies urgency, frequency, pain with urination, burning sensation, blood in urine, odor or discharge. Musculoskeletal: Denies decrease in range of motion, difficulty with gait, muscle pain or joint pain and swelling.  Skin: Denies redness, rashes, lesions or ulcercations.  Neurological: Patient reports insomnia, paresthesia in hands.  Denies dizziness, difficulty with memory, difficulty with speech or problems with balance and coordination.  Psych: Denies anxiety, depression, SI/HI.  No other specific complaints in a complete review of systems (except as listed in HPI above).      Objective:   Physical Exam  BP (!) 158/90   Pulse 97   Ht 5\' 2"  (1.575 m)   Wt 148 lb (67.1 kg)   SpO2 98%   BMI 27.07 kg/m   Wt Readings from Last 3 Encounters:  12/30/22 150 lb (68 kg)  10/27/22 148 lb (67.1 kg)  01/17/22 153 lb (69.4 kg)    General: Appears her stated age, overweight, in NAD. Skin: Warm, dry and intact.  HEENT: Head: normal shape and size; Eyes: sclera white, no icterus, conjunctiva pink, PERRLA and EOMs intact;  Neck:  Neck supple, trachea midline. No masses, lumps or thyromegaly present.  Cardiovascular: Normal rate and rhythm. S1,S2 noted.  No murmur, rubs or gallops noted. No JVD or BLE edema. No carotid bruits noted. Pulmonary/Chest: Normal  effort and positive vesicular breath sounds. No respiratory distress. No wheezes, rales or ronchi noted.  Abdomen: Soft and nontender. Normal bowel sounds.  Musculoskeletal: Strength 5/5 BUE/BLE. No difficulty with gait.  Neurological: Alert and oriented. Cranial nerves II-XII grossly intact. Coordination normal.  Psychiatric: Mood and affect normal. Behavior is normal. Judgment and thought content normal.    BMET    Component Value Date/Time   NA 142 01/17/2022 1506   K 4.5 01/17/2022 1506   CL 104 01/17/2022 1506   CO2 24 01/17/2022 1506   GLUCOSE 95 01/17/2022 1506   GLUCOSE 127 (H) 02/18/2018 0524   BUN 12 01/17/2022 1506   CREATININE 0.52 (L) 01/17/2022 1506   CREATININE 0.75 12/16/2016 1111   CALCIUM 10.2 01/17/2022 1506   GFRNONAA 94 05/20/2019 1504  GFRNONAA 87 12/16/2016 1111   GFRAA 108 05/20/2019 1504   GFRAA 101 12/16/2016 1111    Lipid Panel     Component Value Date/Time   CHOL 243 (H) 01/17/2022 1506   TRIG 536 (H) 01/17/2022 1506   HDL 43 01/17/2022 1506   CHOLHDL 5.7 (H) 01/17/2022 1506   CHOLHDL 3.2 12/16/2016 1111   LDLCALC 108 (H) 01/17/2022 1506   LDLCALC 109 (H) 12/16/2016 1111    CBC    Component Value Date/Time   WBC 9.2 01/17/2022 1506   WBC 15.6 (H) 02/18/2018 0524   RBC 5.27 01/17/2022 1506   RBC 5.02 02/18/2018 0524   HGB 15.4 01/17/2022 1506   HCT 46.0 01/17/2022 1506   PLT 412 01/17/2022 1506   MCV 87 01/17/2022 1506   MCH 29.2 01/17/2022 1506   MCH 28.7 02/18/2018 0524   MCHC 33.5 01/17/2022 1506   MCHC 32.4 02/18/2018 0524   RDW 12.4 01/17/2022 1506   LYMPHSABS 1.9 08/20/2020 1140   MONOABS 1.1 (H) 02/18/2018 0524   EOSABS 0.3 08/20/2020 1140   BASOSABS 0.1 08/20/2020 1140    Hgb A1C Lab Results  Component Value Date   HGBA1C 5.7 (H) 01/17/2022            Assessment & Plan:   Preventative health maintenance:  She declines flu shot today She declines tetanus for financial reasons, advised her if she gets  better cut to go get this done Encouraged her to get her COVID-vaccine She declines pneumonia vaccination Discussed Shingrix vaccine, she will check coverage with her insurance company and schedule visit if she would like to have this done She no longer needs to screen for cervical cancer Mammogram and bone density ordered-she will call to schedule Colon screening UTD Encouraged her to consume a balanced diet and exercise regimen Advised her to see an eye doctor and dentist annually We will check CBC, c-Met, lipid, A1c today  RTC in 2 weeks, followup HTN, 6 months, follow-up chronic conditions Nicki Reaper, NP

## 2023-01-28 LAB — CBC
HCT: 44.2 % (ref 35.0–45.0)
Hemoglobin: 14.3 g/dL (ref 11.7–15.5)
MCH: 28.1 pg (ref 27.0–33.0)
MCHC: 32.4 g/dL (ref 32.0–36.0)
MCV: 86.8 fL (ref 80.0–100.0)
MPV: 10.1 fL (ref 7.5–12.5)
Platelets: 332 10*3/uL (ref 140–400)
RBC: 5.09 10*6/uL (ref 3.80–5.10)
RDW: 12.5 % (ref 11.0–15.0)
WBC: 6.1 10*3/uL (ref 3.8–10.8)

## 2023-01-28 LAB — COMPLETE METABOLIC PANEL WITH GFR
AG Ratio: 1.9 (calc) (ref 1.0–2.5)
ALT: 13 U/L (ref 6–29)
AST: 19 U/L (ref 10–35)
Albumin: 4.3 g/dL (ref 3.6–5.1)
Alkaline phosphatase (APISO): 94 U/L (ref 37–153)
BUN: 16 mg/dL (ref 7–25)
CO2: 23 mmol/L (ref 20–32)
Calcium: 9.4 mg/dL (ref 8.6–10.4)
Chloride: 107 mmol/L (ref 98–110)
Creat: 0.64 mg/dL (ref 0.50–1.05)
Globulin: 2.3 g/dL (ref 1.9–3.7)
Glucose, Bld: 119 mg/dL (ref 65–139)
Potassium: 3.9 mmol/L (ref 3.5–5.3)
Sodium: 141 mmol/L (ref 135–146)
Total Bilirubin: 0.8 mg/dL (ref 0.2–1.2)
Total Protein: 6.6 g/dL (ref 6.1–8.1)
eGFR: 98 mL/min/{1.73_m2} (ref >=60)

## 2023-01-28 LAB — LIPID PANEL
Cholesterol: 216 mg/dL — ABNORMAL HIGH (ref <200)
HDL: 64 mg/dL (ref >=50)
LDL Cholesterol (Calc): 133 mg/dL — ABNORMAL HIGH
Non-HDL Cholesterol (Calc): 152 mg/dL — ABNORMAL HIGH (ref <130)
Total CHOL/HDL Ratio: 3.4 (calc) (ref <5.0)
Triglycerides: 86 mg/dL (ref <150)

## 2023-01-28 LAB — HEMOGLOBIN A1C
Hgb A1c MFr Bld: 5.7 %{Hb} — ABNORMAL HIGH (ref <5.7)
Mean Plasma Glucose: 117 mg/dL
eAG (mmol/L): 6.5 mmol/L

## 2023-02-10 ENCOUNTER — Encounter: Payer: Self-pay | Admitting: Internal Medicine

## 2023-02-10 ENCOUNTER — Ambulatory Visit (INDEPENDENT_AMBULATORY_CARE_PROVIDER_SITE_OTHER): Payer: Medicare Other | Admitting: Internal Medicine

## 2023-02-10 VITALS — BP 124/88 | Ht 62.0 in | Wt 149.0 lb

## 2023-02-10 DIAGNOSIS — J329 Chronic sinusitis, unspecified: Secondary | ICD-10-CM

## 2023-02-10 DIAGNOSIS — B9789 Other viral agents as the cause of diseases classified elsewhere: Secondary | ICD-10-CM

## 2023-02-10 DIAGNOSIS — I10 Essential (primary) hypertension: Secondary | ICD-10-CM | POA: Diagnosis not present

## 2023-02-10 MED ORDER — METHYLPREDNISOLONE ACETATE 80 MG/ML IJ SUSP
80.0000 mg | Freq: Once | INTRAMUSCULAR | Status: AC
  Administered 2023-02-10: 80 mg via INTRAMUSCULAR

## 2023-02-10 MED ORDER — OLMESARTAN MEDOXOMIL 20 MG PO TABS: 20.0000 mg | ORAL_TABLET | Freq: Every day | ORAL | 1 refills | Status: DC

## 2023-02-10 NOTE — Patient Instructions (Signed)

## 2023-02-10 NOTE — Progress Notes (Signed)
 Subjective:    Patient ID: Ashley Wallace, female    DOB: 1955/02/20, 68 y.o.   MRN: [id number]  HPI  Patient presents to clinic today for 2-week follow-up of HTN.  At her last visit, she was started on olmesartan .  She has been taking medication as prescribed.  Her BP today is.  There is no ECG on file.  She also reports runny nose, ringing in the ears, sore throat and post nasal drip. She reports this started about 1 week ago but symptoms worsened this morning. She is blowing yellow mucous out of her nose. She denies difficulty swallowing. She denies headache, nasal congestion, ear pain, cough, shortness of breath, nausea, vomiting, or diarrhea. She denies fever, chills or body aches. She has not tried anything OTC for this. She has had sick contacts.  Review of Systems   Past Medical History:  Diagnosis Date   Acid reflux 12/02/2014   Arthritis    Headache    HTN (hypertension) 01/27/2023   Left thyroid  nodule    Muscle spasms of neck 12/02/2014    Current Outpatient Medications  Medication Sig Dispense Refill   Fluticasone  Furoate (ARNUITY ELLIPTA ) 200 MCG/ACT AEPB Inhale 1 puff into the lungs daily in the afternoon. 30 each 6   olmesartan  (BENICAR ) 20 MG tablet Take 1 tablet (20 mg total) by mouth daily. 30 tablet 0   omeprazole  (PRILOSEC) 20 MG capsule Take 1 capsule (20 mg total) by mouth daily. 90 capsule 1   No current facility-administered medications for this visit.    Allergies  Allergen Reactions   Sulfamethoxazole -Trimethoprim      Family History  Problem Relation Age of Onset   Hypertension Mother    Diabetes Mother        non-Insulin dependent   Diabetes Sister    Hypertension Sister    Diabetes Sister    Hypertension Sister    Hypertension Brother    Leukemia Brother    Cancer Brother        kidney   Hypertension Brother    Breast cancer Maternal Aunt    Breast cancer Cousin    Breast cancer Cousin     Social History   Socioeconomic  History   Marital status: Widowed    Spouse name: Not on file   Number of children: Not on file   Years of education: Not on file   Highest education level: 12th grade  Occupational History   Not on file  Tobacco Use   Smoking status: Never   Smokeless tobacco: Never  Vaping Use   Vaping status: Never Used  Substance and Sexual Activity   Alcohol use: No   Drug use: No   Sexual activity: Yes  Other Topics Concern   Not on file  Social History Narrative   Not on file   Social Drivers of Health   Financial Resource Strain: Low Risk  (01/23/2023)   Overall Financial Resource Strain (CARDIA)    Difficulty of Paying Living Expenses: Not hard at all  Food Insecurity: No Food Insecurity (01/23/2023)   Hunger Vital Sign    Worried About Running Out of Food in the Last Year: Never true    Ran Out of Food in the Last Year: Never true  Transportation Needs: No Transportation Needs (01/23/2023)   PRAPARE - Administrator, Civil Service (Medical): No    Lack of Transportation (Non-Medical): No  Physical Activity: Sufficiently Active (01/23/2023)   Exercise Vital Sign  Days of Exercise per Week: 5 days    Minutes of Exercise per Session: 30 min  Stress: No Stress Concern Present (01/23/2023)   [employer] of Occupational Health - Occupational Stress Questionnaire    Feeling of Stress : Only a little  Social Connections: Unknown (01/23/2023)   Social Connection and Isolation Panel [NHANES]    Frequency of Communication with Friends and Family: More than three times a week    Frequency of Social Gatherings with Friends and Family: More than three times a week    Attends Religious Services: Patient declined    Database Administrator or Organizations: No    Attends Banker Meetings: Not on file    Marital Status: Widowed  Intimate Partner Violence: Not on file     Constitutional: Pt reports intermittent headaches. Denies fever, malaise, fatigue, or  abrupt weight changes.  HEENT: Patient runny nose, ringing in the ears and sore throat.  Denies eye pain, eye redness, ear pain, wax buildup, nasal congestion, bloody nose. Respiratory: Denies difficulty breathing, shortness of breath, cough or sputum production.   Cardiovascular: Denies chest pain, chest tightness, palpitations or swelling in the hands or feet.  Gastrointestinal: Pt reports constipation. Denies abdominal pain, bloating, diarrhea or blood in the stool.  GU: Denies urgency, frequency, pain with urination, burning sensation, blood in urine, odor or discharge. Musculoskeletal: Denies decrease in range of motion, difficulty with gait, muscle pain or joint pain and swelling.  Skin: Denies redness, rashes, lesions or ulcercations.  Neurological: Patient reports insomnia, paresthesia in hands.  Denies dizziness, difficulty with memory, difficulty with speech or problems with balance and coordination.  Psych: Denies anxiety, depression, SI/HI.  No other specific complaints in a complete review of systems (except as listed in HPI above).      Objective:   Physical Exam  BP 124/88 (BP Location: Left Arm, Patient Position: Sitting, Cuff Size: Normal)   Ht 5' 2 (1.575 m)   Wt 149 lb (67.6 kg)   BMI 27.25 kg/m    Wt Readings from Last 3 Encounters:  01/27/23 148 lb (67.1 kg)  12/30/22 150 lb (68 kg)  10/27/22 148 lb (67.1 kg)    General: Appears her stated age, overweight, in NAD. Skin: Warm, dry and intact.  HEENT: Head: normal shape and size, no sinus tenderness noted; Eyes: sclera white, no icterus, conjunctiva pink, PERRLA and EOMs intact; ears: TMs gray and intact, normal light reflex; Nose: Mucosa pink and moist, septum midline; Throat: Mucosa erythematous and moist, + PND, no exudate, lesions or ulcerations noted. Neck: No adenopathy noted. Cardiovascular: Normal rate and rhythm.  Pulmonary/Chest: Normal effort and positive vesicular breath sounds. No respiratory  distress. No wheezes, rales or ronchi noted.  Neurological: Alert and oriented.   BMET    Component Value Date/Time   NA 141 01/27/2023 0853   NA 142 01/17/2022 1506   K 3.9 01/27/2023 0853   CL 107 01/27/2023 0853   CO2 23 01/27/2023 0853   GLUCOSE 119 01/27/2023 0853   BUN 16 01/27/2023 0853   BUN 12 01/17/2022 1506   CREATININE 0.64 01/27/2023 0853   CALCIUM 9.4 01/27/2023 0853   GFRNONAA 94 05/20/2019 1504   GFRNONAA 87 12/16/2016 1111   GFRAA 108 05/20/2019 1504   GFRAA 101 12/16/2016 1111    Lipid Panel     Component Value Date/Time   CHOL 216 (H) 01/27/2023 0853   CHOL 243 (H) 01/17/2022 1506   TRIG 86 01/27/2023 0853  HDL 64 01/27/2023 0853   HDL 43 01/17/2022 1506   CHOLHDL 3.4 01/27/2023 0853   LDLCALC 133 (H) 01/27/2023 0853    CBC    Component Value Date/Time   WBC 6.1 01/27/2023 0853   RBC 5.09 01/27/2023 0853   HGB 14.3 01/27/2023 0853   HGB 15.4 01/17/2022 1506   HCT 44.2 01/27/2023 0853   HCT 46.0 01/17/2022 1506   PLT 332 01/27/2023 0853   PLT 412 01/17/2022 1506   MCV 86.8 01/27/2023 0853   MCV 87 01/17/2022 1506   MCH 28.1 01/27/2023 0853   MCHC 32.4 01/27/2023 0853   RDW 12.5 01/27/2023 0853   RDW 12.4 01/17/2022 1506   LYMPHSABS 1.9 08/20/2020 1140   MONOABS 1.1 (H) 02/18/2018 0524   EOSABS 0.3 08/20/2020 1140   BASOSABS 0.1 08/20/2020 1140    Hgb A1C Lab Results  Component Value Date   HGBA1C 5.7 (H) 01/27/2023            Assessment & Plan:   Viral sinusitis:  Can use a neti pot which can be purchased from your local pharmacy Recommend taking Flonase  OTC 80 mg Depo-Medrol  IM No indication for antibiotics at this time  RTC in 6 months, follow-up chronic conditions Angeline Laura, NP

## 2023-02-10 NOTE — Addendum Note (Signed)
 Addended by: Luanna Cole D on: 02/10/2023 10:48 AM   Modules accepted: Orders

## 2023-02-10 NOTE — Assessment & Plan Note (Signed)
 Controlled on olmesartan, refilled today

## 2023-03-15 ENCOUNTER — Encounter: Payer: Self-pay | Admitting: Podiatry

## 2023-03-15 ENCOUNTER — Ambulatory Visit: Payer: Medicare Other | Admitting: Podiatry

## 2023-03-15 DIAGNOSIS — M2011 Hallux valgus (acquired), right foot: Secondary | ICD-10-CM

## 2023-03-15 NOTE — Progress Notes (Signed)
 She presents today after having not seen her since June states that left foot is getting much more sore as she refers to her right foot.  States that she has tried different shoes boots alterations to those shoes anti-inflammatories and nothing really seems to make a difference.  We have tried injections steroids nonsteroidals and nothing has helped.  ROS: She denies fever chills nausea vomit muscle aches pains calf pain back pain chest pain shortness of breath.  Objective: She denies any changes in her past medical history medications allergies or surgery.  Pulses are strongly palpable right foot.  Neurologic sensorium is intact Deetjen reflexes are intact muscle strength is normal and symmetrical.  She has limited range of motion with crepitation first metatarsophalangeal joint of the right foot with a mild contracture at the level of the second metatarsal phalangeal joint which is reducible.  I reviewed previous radiographs which do demonstrate joint space narrowing subchondral sclerosis dorsal eburnation.  Also demonstrated hammertoe deformity second right.  Assessment: Moderate severe hallux limitus first metatarsal phalangeal joint of the right foot with flexible hammertoe deformity.  Plan: Discussed etiology pathology conservative surgical therapies.  At this point we consented her today for a Keller arthroplasty with a silicone implant.  I did discuss other options with her.  This is the one she chose.  We did discuss the possible postop complications which may include but are not limited to postop pain bleeding swell infection recurrence need further surgery overcorrection under correction also digit loss limb loss of life.

## 2023-03-16 ENCOUNTER — Ambulatory Visit: Payer: Medicare Other

## 2023-03-20 ENCOUNTER — Telehealth: Payer: Self-pay | Admitting: Urology

## 2023-03-20 NOTE — Telephone Encounter (Signed)
 Received sx papers called and LM for pt to call back when she is ready to schedule sx with Dr. Lara Plants.

## 2023-03-22 ENCOUNTER — Telehealth: Payer: Self-pay | Admitting: Podiatry

## 2023-03-22 NOTE — Telephone Encounter (Signed)
DOS-04/14/23  [place] Smoker IMPLANT [id number] CAPSULOTOMY MPJ 2ND [id number] HAMMERTOE REPAIR 2ND [id number]   [employer] EFFECTIVE DATE- 02/08/23  SPOKE WITH LEAH M FROM [employer] AND SHE STATED THAT PRIOR AUTH IS NOT REQUIRED FOR CPT CODES [phone] AND [id number].  CALL REF #: [id number] 03/22/23 @9 :20 AM EST

## 2023-04-02 ENCOUNTER — Other Ambulatory Visit: Payer: Self-pay | Admitting: Internal Medicine

## 2023-04-04 NOTE — Telephone Encounter (Signed)
 Requested Prescriptions  Pending Prescriptions Disp Refills   omeprazole (PRILOSEC) 20 MG capsule [Pharmacy Med Name: OMEPRAZOLE DR 20 MG CAPSULE] 90 capsule 0    Sig: TAKE 1 CAPSULE BY MOUTH EVERY DAY     Gastroenterology: Proton Pump Inhibitors Passed - 04/04/2023  9:08 AM      Passed - Valid encounter within last 12 months    Recent Outpatient Visits           1 month ago Viral sinusitis   [place] [facility] [place], Salvadore Oxford, NP   2 months ago Encounter for general adult medical examination with abnormal findings   [place] [facility] [place], [place] W, NP   5 months ago Gastroesophageal reflux disease without esophagitis   [place] [facility] [place], Salvadore Oxford, NP   1 year ago Annual physical exam   [facility] Malva Limes, MD   1 year ago Sore throat   [place] [facility] Malva Limes, MD       Future Appointments             In 4 months Baity, Salvadore Oxford, NP  [facility], [facility]

## 2023-04-06 ENCOUNTER — Other Ambulatory Visit
Admission: RE | Admit: 2023-04-06 | Discharge: 2023-04-06 | Disposition: A | Discharge status: Home / Self Care | Payer: Medicare Other | Source: Ambulatory Visit | Attending: Internal Medicine | Admitting: Internal Medicine

## 2023-04-06 ENCOUNTER — Ambulatory Visit
Admission: RE | Admit: 2023-04-06 | Discharge: 2023-04-06 | Disposition: A | Discharge status: Home / Self Care | Payer: Medicare Other | Source: Ambulatory Visit | Attending: Internal Medicine | Admitting: Internal Medicine

## 2023-04-06 ENCOUNTER — Encounter: Payer: Self-pay | Admitting: Pulmonary Disease

## 2023-04-06 ENCOUNTER — Ambulatory Visit: Payer: Medicare Other

## 2023-04-06 ENCOUNTER — Encounter: Payer: Self-pay | Admitting: Internal Medicine

## 2023-04-06 ENCOUNTER — Ambulatory Visit: Payer: Medicare Other | Admitting: Pulmonary Disease

## 2023-04-06 VITALS — BP 104/74 | HR 108 | Temp 98.3°F | Ht 67.0 in | Wt 147.0 lb

## 2023-04-06 DIAGNOSIS — Z1231 Encounter for screening mammogram for malignant neoplasm of breast: Secondary | ICD-10-CM | POA: Insufficient documentation

## 2023-04-06 DIAGNOSIS — M85831 Other specified disorders of bone density and structure, right forearm: Secondary | ICD-10-CM | POA: Diagnosis not present

## 2023-04-06 DIAGNOSIS — Z780 Asymptomatic menopausal state: Secondary | ICD-10-CM | POA: Insufficient documentation

## 2023-04-06 DIAGNOSIS — R0602 Shortness of breath: Secondary | ICD-10-CM | POA: Diagnosis not present

## 2023-04-06 LAB — PULMONARY FUNCTION TEST ARMC ONLY
DL/VA % pred: 96 %
DL/VA: 4.09 ml/min/mmHg/L
DLCO unc % pred: 98 %
DLCO unc: 18.27 ml/min/mmHg
FEF 25-75 Post: 2.36 L/s
FEF 25-75 Pre: 2.16 L/s
FEF2575-%Change-Post: 9 %
FEF2575-%Pred-Post: 117 %
FEF2575-%Pred-Pre: 106 %
FEV1-%Change-Post: 1 %
FEV1-%Pred-Post: 99 %
FEV1-%Pred-Pre: 97 %
FEV1-Post: 2.22 L
FEV1-Pre: 2.18 L
FEV1FVC-%Change-Post: 3 %
FEV1FVC-%Pred-Pre: 105 %
FEV6-%Change-Post: -1 %
FEV6-%Pred-Post: 95 %
FEV6-%Pred-Pre: 96 %
FEV6-Post: 2.67 L
FEV6-Pre: 2.7 L
FEV6FVC-%Pred-Post: 104 %
FEV6FVC-%Pred-Pre: 104 %
FVC-%Change-Post: -1 %
FVC-%Pred-Post: 91 %
FVC-%Pred-Pre: 92 %
FVC-Post: 2.67 L
FVC-Pre: 2.7 L
Post FEV1/FVC ratio: 83 %
Post FEV6/FVC ratio: 100 %
Pre FEV1/FVC ratio: 81 %
Pre FEV6/FVC Ratio: 100 %
RV % pred: 115 %
RV: 2.29 L
TLC % pred: 105 %
TLC: 5 L

## 2023-04-06 LAB — CBC WITH DIFFERENTIAL/PLATELET
Abs Immature Granulocytes: 0.03 10*3/uL (ref 0.00–0.07)
Basophils Absolute: 0 10*3/uL (ref 0.0–0.1)
Basophils Relative: 1 %
Eosinophils Absolute: 0.1 10*3/uL (ref 0.0–0.5)
Eosinophils Relative: 2 %
HCT: 42.1 % (ref 36.0–46.0)
Hemoglobin: 14.3 g/dL (ref 12.0–15.0)
Immature Granulocytes: 0 %
Lymphocytes Relative: 26 %
Lymphs Abs: 1.8 10*3/uL (ref 0.7–4.0)
MCH: 28.9 pg (ref 26.0–34.0)
MCHC: 34 g/dL (ref 30.0–36.0)
MCV: 85.2 fL (ref 80.0–100.0)
Monocytes Absolute: 0.4 10*3/uL (ref 0.1–1.0)
Monocytes Relative: 5 %
Neutro Abs: 4.5 10*3/uL (ref 1.7–7.7)
Neutrophils Relative %: 66 %
Platelets: 293 10*3/uL (ref 150–400)
RBC: 4.94 MIL/uL (ref 3.87–5.11)
RDW: 13.2 % (ref 11.5–15.5)
WBC: 6.8 10*3/uL (ref 4.0–10.5)
nRBC: 0 % (ref 0.0–0.2)

## 2023-04-06 MED ORDER — FLUTICASONE-SALMETEROL 250-50 MCG/ACT IN AEPB: 1.0000 | INHALATION_SPRAY | Freq: Two times a day (BID) | RESPIRATORY_TRACT | 3 refills | Status: DC

## 2023-04-06 MED ORDER — ALBUTEROL SULFATE (2.5 MG/3ML) 0.083% IN NEBU
2.5000 mg | INHALATION_SOLUTION | Freq: Once | RESPIRATORY_TRACT | Status: AC
  Administered 2023-04-06: 2.5 mg via RESPIRATORY_TRACT
  Filled 2023-04-06: qty 3

## 2023-04-06 MED ORDER — FLUTICASONE FUROATE-VILANTEROL 200-25 MCG/ACT IN AEPB: 1.0000 | INHALATION_SPRAY | Freq: Every day | RESPIRATORY_TRACT | 3 refills | Status: DC

## 2023-04-06 NOTE — Progress Notes (Signed)
 Synopsis: Referred in by Lorre Munroe, NP   Subjective:   PATIENT ID: Ashley Wallace GENDER: female DOB: 02-28-1955, MRN: [id number]  Chief Complaint  Patient presents with   Follow-up    HPI Ashley Wallace is a 68 year old female patient with a past medical history of GERD presenting today to the pulmonary clinic for ongoing cough and shortness of breath on exertion.  She reports that she contracted COVID-19 last year and afterwards she started having intermittent cough dry throughout the day.  She contracted COVID-19 again in October 2024 and afterwards her cough has worsened along with shortness of breath on exertion.  She denies wheezing or chest tightness.  She denies a diagnosis of asthma.  She was never hospitalized for breathing issues.  She was prescribed prednisone as outpatient as well as albuterol as needed which helped a little.  She does have GERD and takes over-the-counter Prilosec.  Started her on Symbicort during the last visit but felt that it increased her blood pressure and therefore started on Arnuity.   Reports ongoing cough today with nasal congestion. Possibly Allergies vs Viral infection.   PFTs 2025 overall normal but with reduced PEFR.   Family history -father with lung cancer and emphysema was a smoker.  Social history -never smoker denies illicit drug use lives at home by herself.  Has 2 kids who are healthy.  ROS All systems were reviewed and are negative except for the above Objective:   There were no vitals filed for this visit.    on RA BMI Readings from Last 3 Encounters:  02/10/23 27.25 kg/m  01/27/23 27.07 kg/m  12/30/22 27.44 kg/m   Wt Readings from Last 3 Encounters:  02/10/23 149 lb (67.6 kg)  01/27/23 148 lb (67.1 kg)  12/30/22 150 lb (68 kg)    Physical Exam GEN: NAD, Healthy Appearing HEENT: Supple Neck, Reactive Pupils, EOMI  CVS: Normal S1, Normal S2, RRR, No murmurs or ES appreciated  Lungs: Clear bilateral  air entry.  Abdomen: Soft, non tender, non distended, + BS  Extremities: Warm and well perfused, No edema  Skin: No suspicious lesions appreciated  Psych: Normal Affect  Labs and imaging were reviewed. Ancillary Information   CBC    Component Value Date/Time   WBC 6.1 01/27/2023 0853   RBC 5.09 01/27/2023 0853   HGB 14.3 01/27/2023 0853   HGB 15.4 01/17/2022 1506   HCT 44.2 01/27/2023 0853   HCT 46.0 01/17/2022 1506   PLT 332 01/27/2023 0853   PLT 412 01/17/2022 1506   MCV 86.8 01/27/2023 0853   MCV 87 01/17/2022 1506   MCH 28.1 01/27/2023 0853   MCHC 32.4 01/27/2023 0853   RDW 12.5 01/27/2023 0853   RDW 12.4 01/17/2022 1506   LYMPHSABS 1.9 08/20/2020 1140   MONOABS 1.1 (H) 02/18/2018 0524   EOSABS 0.3 08/20/2020 1140   BASOSABS 0.1 08/20/2020 1140       Latest Ref Rng & Units 04/06/2023   10:51 AM  PFT Results  FVC-Pre L 2.70  P  FVC-Predicted Pre % 92  P  FVC-Post L 2.67  P  FVC-Predicted Post % 91  P  Pre FEV1/FVC % % 81  P  Post FEV1/FCV % % 83  P  FEV1-Pre L 2.18  P  FEV1-Predicted Pre % 97  P  FEV1-Post L 2.22  P  DLCO uncorrected ml/min/mmHg 18.27  P  DLCO UNC% % 98  P  DLVA Predicted % 96  P  TLC  L 5.00  P  TLC % Predicted % 105  P  RV % Predicted % 115  P    P Preliminary result     Assessment & Plan:  Ashley Wallace is a 68 year old female patient with a past medical history of GERD presenting today to the pulmonary clinic for ongoing cough and shortness of breath on exertion.  # Post-COVID reactive airway disease/adult onset asthma Feno 62  []  D/c Arnuity and start Breo Ellipta 1 puff daily. Advised mouth rinsing afterwards. []  C.w Albuterol as needed.   #Surgical clearance  She is pending [employer] Implant, capsulotomy and hammertoe repair. No contraindication from a pulmonary standpoint and post op pulmonary complications are minimal.   # GERD Continue Prilosec 20 mg daily.  #Allergic rhinitis  C.w Zyrtec and Flonase.   No  follow-ups on file.  I spent 30 minutes caring for this patient today, including preparing to see the patient, obtaining a medical history , reviewing a separately obtained history, performing a medically appropriate examination and/or evaluation, counseling and educating the patient/family/caregiver, ordering medications, tests, or procedures, documenting clinical information in the electronic health record, and independently interpreting results (not separately reported/billed) and communicating results to the patient/family/caregiver  Janann Colonel, MD [facility] 04/06/2023 10:58 AM

## 2023-04-06 NOTE — Addendum Note (Signed)
 Addended by: Janann Colonel on: 04/06/2023 02:43 PM   Modules accepted: Orders

## 2023-04-09 ENCOUNTER — Encounter: Payer: Self-pay | Admitting: Pulmonary Disease

## 2023-04-09 DIAGNOSIS — J4530 Mild persistent asthma, uncomplicated: Secondary | ICD-10-CM

## 2023-04-10 MED ORDER — FLUTICASONE PROPIONATE HFA 110 MCG/ACT IN AERO: 2.0000 | INHALATION_SPRAY | Freq: Two times a day (BID) | RESPIRATORY_TRACT | 2 refills | Status: DC

## 2023-04-11 LAB — ALLERGEN PANEL (27) + IGE
Alternaria Alternata IgE: <0.1 kU/L
Aspergillus Fumigatus IgE: <0.1 kU/L
Bahia Grass IgE: <0.1 kU/L
Bermuda Grass IgE: <0.1 kU/L
Cat Dander IgE: <0.1 kU/L
Cedar, Mountain IgE: <0.1 kU/L
Cladosporium Herbarum IgE: <0.1 kU/L
Cocklebur IgE: <0.1 kU/L
Cockroach, American IgE: <0.1 kU/L
Common Silver Birch IgE: <0.1 kU/L
D Farinae IgE: <0.1 kU/L
D Pteronyssinus IgE: <0.1 kU/L
Dog Dander IgE: <0.1 kU/L
Elm, American IgE: <0.1 kU/L
Hickory, White IgE: <0.1 kU/L
IgE (Immunoglobulin E), Serum: 27 [IU]/mL (ref 6–495)
Johnson Grass IgE: <0.1 kU/L
Kentucky Bluegrass IgE: <0.1 kU/L
Maple/Box Elder IgE: <0.1 kU/L
Mucor Racemosus IgE: <0.1 kU/L
Oak, White IgE: <0.1 kU/L
Penicillium Chrysogen IgE: <0.1 kU/L
Pigweed, Rough IgE: <0.1 kU/L
Plantain, English IgE: <0.1 kU/L
Ragweed, Short IgE: <0.1 kU/L
Setomelanomma Rostrat: <0.1 kU/L
Timothy Grass IgE: <0.1 kU/L
White Mulberry IgE: <0.1 kU/L

## 2023-04-12 ENCOUNTER — Other Ambulatory Visit: Payer: Self-pay | Admitting: Podiatry

## 2023-04-12 MED ORDER — ONDANSETRON HCL 4 MG PO TABS: 4.0000 mg | ORAL_TABLET | Freq: Three times a day (TID) | ORAL | 0 refills | Status: DC | PRN

## 2023-04-12 MED ORDER — CEPHALEXIN 500 MG PO CAPS: 500.0000 mg | ORAL_CAPSULE | Freq: Three times a day (TID) | ORAL | 0 refills | Status: DC

## 2023-04-12 MED ORDER — OXYCODONE-ACETAMINOPHEN 10-325 MG PO TABS: 1.0000 | ORAL_TABLET | Freq: Three times a day (TID) | ORAL | 0 refills | Status: AC | PRN

## 2023-04-14 DIAGNOSIS — M2011 Hallux valgus (acquired), right foot: Secondary | ICD-10-CM | POA: Diagnosis not present

## 2023-04-14 DIAGNOSIS — M2041 Other hammer toe(s) (acquired), right foot: Secondary | ICD-10-CM | POA: Diagnosis not present

## 2023-04-14 DIAGNOSIS — G8918 Other acute postprocedural pain: Secondary | ICD-10-CM | POA: Diagnosis not present

## 2023-04-14 DIAGNOSIS — M7751 Other enthesopathy of right foot: Secondary | ICD-10-CM | POA: Diagnosis not present

## 2023-04-19 ENCOUNTER — Encounter: Payer: Self-pay | Admitting: Podiatry

## 2023-04-19 ENCOUNTER — Ambulatory Visit (INDEPENDENT_AMBULATORY_CARE_PROVIDER_SITE_OTHER): Payer: Medicare Other | Admitting: Podiatry

## 2023-04-19 ENCOUNTER — Ambulatory Visit (INDEPENDENT_AMBULATORY_CARE_PROVIDER_SITE_OTHER)

## 2023-04-19 DIAGNOSIS — M2011 Hallux valgus (acquired), right foot: Secondary | ICD-10-CM | POA: Diagnosis not present

## 2023-04-19 DIAGNOSIS — Z9889 Other specified postprocedural states: Secondary | ICD-10-CM

## 2023-04-19 DIAGNOSIS — M2041 Other hammer toe(s) (acquired), right foot: Secondary | ICD-10-CM

## 2023-04-19 NOTE — Progress Notes (Signed)
 She presents today date of surgery 04/14/2023.  She is status post Keller arthroplasty with a single silicone implant and tenotomy second toe and is doing really well.  She states that is feeling good I just take the pain meds at night and ibuprofen throughout the day if I needed.  Objective: Vital signs are stable alert and oriented x 3.  Pulses are palpable.  There is no erythema just some mild edema no cellulitis drainage or odor incision is healing very nicely.  She has good range of motion passively and actively of the first metatarsophalangeal joint.  Radiographs taken today demonstrate osseously mature foot with moderate edema in the soft tissue of the forefoot.  This is noted on lateral view.  She also has retained a silicone implant which is in good position.  Assessment: Well-healing surgical foot.  Plan: Redressed today dressed a compressive dressing put her back in her cam boot and I will follow-up with her in 1 to 2 weeks.

## 2023-05-03 ENCOUNTER — Ambulatory Visit (INDEPENDENT_AMBULATORY_CARE_PROVIDER_SITE_OTHER): Payer: Medicare Other | Admitting: Podiatry

## 2023-05-03 ENCOUNTER — Encounter: Payer: Self-pay | Admitting: Podiatry

## 2023-05-03 DIAGNOSIS — M2011 Hallux valgus (acquired), right foot: Secondary | ICD-10-CM

## 2023-05-03 DIAGNOSIS — Z9889 Other specified postprocedural states: Secondary | ICD-10-CM

## 2023-05-03 DIAGNOSIS — M2041 Other hammer toe(s) (acquired), right foot: Secondary | ICD-10-CM

## 2023-05-03 NOTE — Progress Notes (Signed)
 Mother presents today for a follow-up of her Lorenz Coaster arthroplasty with a single silicone implant first metatarsal phalangeal joint second metatarsal phalangeal joint osteotomy.  She states that she is doing great she has been getting some burning at night continues to wear her cam boot.  Date of surgery was April 14, 2023.  Objective: Vital signs are stable she is alert oriented x 3.  Pulses are palpable.  Sutures are intact margins well coapted she has good range of motion passively dorsiflexion and plantarflexion.  She has some tenderness on active dorsiflexion and plantarflexion.  Minimal edema no erythema cellulitis drainage or odor  Assessment: Well-healing surgical foot right.  Plan: Removed suture ends today placed her in a compression anklet and a Darco shoe encouraged her to keep this elevated is much as possible she can start doing some more walking and encouraged range of motion exercises.  Encouraged her to utilize the cam boot if she is leaving her house otherwise Darco shoe during the daytime.  She does not have to wear anything at nighttime.  She can start washing this today and start putting lotion on skin.  Follow-up with her in 2 weeks for another set of x-rays and transition to a tennis shoe hopefully.

## 2023-05-17 ENCOUNTER — Ambulatory Visit (INDEPENDENT_AMBULATORY_CARE_PROVIDER_SITE_OTHER): Admitting: Podiatry

## 2023-05-17 ENCOUNTER — Ambulatory Visit (INDEPENDENT_AMBULATORY_CARE_PROVIDER_SITE_OTHER)

## 2023-05-17 ENCOUNTER — Encounter: Payer: Self-pay | Admitting: Podiatry

## 2023-05-17 DIAGNOSIS — M2041 Other hammer toe(s) (acquired), right foot: Secondary | ICD-10-CM

## 2023-05-17 DIAGNOSIS — M2011 Hallux valgus (acquired), right foot: Secondary | ICD-10-CM

## 2023-05-17 DIAGNOSIS — Z9889 Other specified postprocedural states: Secondary | ICD-10-CM

## 2023-05-17 NOTE — Progress Notes (Signed)
 She presents today she is status post Keller arthroplasty single silicone implant nail x 1 month.  She states that she has done really well with it and it does not hurt anymore.  Objective: Vital signs are stable she is alert and oriented x 3.  Incision site is gone on to heal uneventfully and she has great range of motion of the first metatarsophalangeal joint with dorsiflexion and plantarflexion.  Radiographs taken today demonstrate osseously mature individual implant of the first metatarsophalangeal joint silicone single silicone with metal grommets.  Send good position no fractures identified.  Assessment: Well-healing surgical foot.  Plan: Will allow her to get back to her regular activity and follow-up with me in 1 month.

## 2023-05-24 ENCOUNTER — Encounter: Payer: Medicare Other | Admitting: Podiatry

## 2023-06-13 ENCOUNTER — Other Ambulatory Visit: Payer: Self-pay | Admitting: Internal Medicine

## 2023-06-14 ENCOUNTER — Ambulatory Visit (INDEPENDENT_AMBULATORY_CARE_PROVIDER_SITE_OTHER)

## 2023-06-14 ENCOUNTER — Encounter: Payer: Self-pay | Admitting: Podiatry

## 2023-06-14 ENCOUNTER — Ambulatory Visit (INDEPENDENT_AMBULATORY_CARE_PROVIDER_SITE_OTHER): Admitting: Podiatry

## 2023-06-14 DIAGNOSIS — M2011 Hallux valgus (acquired), right foot: Secondary | ICD-10-CM

## 2023-06-14 DIAGNOSIS — M2041 Other hammer toe(s) (acquired), right foot: Secondary | ICD-10-CM

## 2023-06-14 DIAGNOSIS — Z9889 Other specified postprocedural states: Secondary | ICD-10-CM

## 2023-06-14 NOTE — Progress Notes (Signed)
 She presents today date of surgery April 14, 2023 right foot Keller arthroplasty single silicone implant tenotomy's to the lesser toes.  She states that is doing great have been out in the yard working so gets a little tender occasionally.  Objective: Stable alert oriented x 3.  Pulses are palpable.  There is minimal edema no erythema cellulitis drainage or odor she has great passive and active range of motion of the first metatarsal phalangeal joint states it is much more pain-free.  Radiographs taken today demonstrate osseously mature individual mild edema.  Implant is in good position.  Assessment: Well-healing surgical foot.  Plan: Continue range of motion exercises continue regular shoe gear recommend that she continue to wear shoes at all times no bare feet no flip-flops.  I will allow her to rest wear sandals with no thong.

## 2023-06-15 NOTE — Telephone Encounter (Signed)
 Requested by interface surescripts. Last OV 01/27/23 future visit ion 08/03/23.  Requested Prescriptions  Pending Prescriptions Disp Refills   omeprazole  (PRILOSEC) 20 MG capsule [Pharmacy Med Name: OMEPRAZOLE  DR 20 MG CAPSULE] 90 capsule 0    Sig: TAKE 1 CAPSULE BY MOUTH EVERY DAY     Gastroenterology: Proton Pump Inhibitors Failed - 06/15/2023 10:52 AM      Failed - Valid encounter within last 12 months    Recent Outpatient Visits   None     Future Appointments             In 1 month Baity, Rankin Buzzard, NP [place] [facility], [facility]

## 2023-06-26 ENCOUNTER — Ambulatory Visit: Payer: Self-pay | Admitting: Pulmonary Disease

## 2023-06-26 DIAGNOSIS — E119 Type 2 diabetes mellitus without complications: Secondary | ICD-10-CM | POA: Diagnosis not present

## 2023-06-26 DIAGNOSIS — H01009 Unspecified blepharitis unspecified eye, unspecified eyelid: Secondary | ICD-10-CM | POA: Diagnosis not present

## 2023-06-26 DIAGNOSIS — H5203 Hypermetropia, bilateral: Secondary | ICD-10-CM | POA: Diagnosis not present

## 2023-06-26 DIAGNOSIS — H01004 Unspecified blepharitis left upper eyelid: Secondary | ICD-10-CM | POA: Diagnosis not present

## 2023-07-12 ENCOUNTER — Encounter: Admitting: Podiatry

## 2023-08-03 ENCOUNTER — Ambulatory Visit (INDEPENDENT_AMBULATORY_CARE_PROVIDER_SITE_OTHER): Payer: Self-pay | Admitting: Internal Medicine

## 2023-08-03 ENCOUNTER — Encounter: Payer: Self-pay | Admitting: Internal Medicine

## 2023-08-03 VITALS — BP 148/90 | Ht 67.0 in | Wt 150.2 lb

## 2023-08-03 DIAGNOSIS — I10 Essential (primary) hypertension: Secondary | ICD-10-CM | POA: Diagnosis not present

## 2023-08-03 DIAGNOSIS — E7800 Pure hypercholesterolemia, unspecified: Secondary | ICD-10-CM | POA: Diagnosis not present

## 2023-08-03 DIAGNOSIS — J4530 Mild persistent asthma, uncomplicated: Secondary | ICD-10-CM

## 2023-08-03 DIAGNOSIS — K219 Gastro-esophageal reflux disease without esophagitis: Secondary | ICD-10-CM | POA: Diagnosis not present

## 2023-08-03 DIAGNOSIS — R7303 Prediabetes: Secondary | ICD-10-CM | POA: Diagnosis not present

## 2023-08-03 DIAGNOSIS — G5603 Carpal tunnel syndrome, bilateral upper limbs: Secondary | ICD-10-CM

## 2023-08-03 DIAGNOSIS — F5101 Primary insomnia: Secondary | ICD-10-CM

## 2023-08-03 DIAGNOSIS — J45909 Unspecified asthma, uncomplicated: Secondary | ICD-10-CM | POA: Insufficient documentation

## 2023-08-03 DIAGNOSIS — M8589 Other specified disorders of bone density and structure, multiple sites: Secondary | ICD-10-CM

## 2023-08-03 DIAGNOSIS — M8580 Other specified disorders of bone density and structure, unspecified site: Secondary | ICD-10-CM | POA: Insufficient documentation

## 2023-08-03 LAB — LIPID PANEL
Cholesterol: 226 mg/dL — ABNORMAL HIGH (ref <200)
HDL: 63 mg/dL (ref >=50)
LDL Cholesterol (Calc): 135 mg/dL — ABNORMAL HIGH
Non-HDL Cholesterol (Calc): 163 mg/dL — ABNORMAL HIGH (ref <130)
Total CHOL/HDL Ratio: 3.6 (calc) (ref <5.0)
Triglycerides: 149 mg/dL (ref <150)

## 2023-08-03 LAB — COMPREHENSIVE METABOLIC PANEL WITH GFR
AG Ratio: 1.8 (calc) (ref 1.0–2.5)
ALT: 20 U/L (ref 6–29)
AST: 23 U/L (ref 10–35)
Albumin: 4.2 g/dL (ref 3.6–5.1)
Alkaline phosphatase (APISO): 78 U/L (ref 37–153)
BUN: 15 mg/dL (ref 7–25)
CO2: 27 mmol/L (ref 20–32)
Calcium: 9.6 mg/dL (ref 8.6–10.4)
Chloride: 105 mmol/L (ref 98–110)
Creat: 0.66 mg/dL (ref 0.50–1.05)
Globulin: 2.3 g/dL (ref 1.9–3.7)
Glucose, Bld: 100 mg/dL (ref 65–139)
Potassium: 4.1 mmol/L (ref 3.5–5.3)
Sodium: 140 mmol/L (ref 135–146)
Total Bilirubin: 0.9 mg/dL (ref 0.2–1.2)
Total Protein: 6.5 g/dL (ref 6.1–8.1)
eGFR: 97 mL/min/{1.73_m2} (ref >=60)

## 2023-08-03 LAB — HEMOGLOBIN A1C
Hgb A1c MFr Bld: 5.7 % — ABNORMAL HIGH (ref <5.7)
Mean Plasma Glucose: 117 mg/dL
eAG (mmol/L): 6.5 mmol/L

## 2023-08-03 LAB — CBC
HCT: 44.4 % (ref 35.0–45.0)
Hemoglobin: 14.3 g/dL (ref 11.7–15.5)
MCH: 28.5 pg (ref 27.0–33.0)
MCHC: 32.2 g/dL (ref 32.0–36.0)
MCV: 88.4 fL (ref 80.0–100.0)
MPV: 9.7 fL (ref 7.5–12.5)
Platelets: 315 10*3/uL (ref 140–400)
RBC: 5.02 10*6/uL (ref 3.80–5.10)
RDW: 12.5 % (ref 11.0–15.0)
WBC: 5.9 10*3/uL (ref 3.8–10.8)

## 2023-08-03 MED ORDER — TRAZODONE HCL 50 MG PO TABS: 25.0000 mg | ORAL_TABLET | Freq: Every evening | ORAL | 1 refills | Status: DC | PRN

## 2023-08-03 NOTE — Assessment & Plan Note (Signed)
 Status post surgical intervention.

## 2023-08-03 NOTE — Assessment & Plan Note (Signed)
Continue calcium and vitamin D OTC Encouraged daily weightbearing exercise 

## 2023-08-03 NOTE — Assessment & Plan Note (Signed)
 Will trial trazodone 25 to 50 mg nightly as needed

## 2023-08-03 NOTE — Assessment & Plan Note (Signed)
 Controlled on omeprazole  20 mg daily Try to avoid foods that trigger reflux

## 2023-08-03 NOTE — Assessment & Plan Note (Signed)
 A1c today Encouraged low carb diet

## 2023-08-03 NOTE — Assessment & Plan Note (Signed)
 C-Met and lipid profile today Encouraged low-fat diet She is hesitant to start statin therapy at this time but will readdress if LDL >100

## 2023-08-03 NOTE — Assessment & Plan Note (Signed)
 Uncontrolled, taking olmesartan  20 mg only as needed Advised her to take olmesartan  20 mg daily as prescribed Reinforced DASH diet C-Met today

## 2023-08-03 NOTE — Patient Instructions (Signed)

## 2023-08-03 NOTE — Progress Notes (Signed)
 HPI  Patient presents to clinic today to establish care and for management of the conditions listed below.  GERD: Triggered by coffee, eggs.  She denies breakthrough on omeprazole .  There is no upper GI on file.  Prediabetes: Her last A1c was 5.7%, 01/2023.  She is not taking any oral diabetic medication at this time.  She does not check her sugars.  HLD: Her last LDL was 133, triglycerides 86, 01/2023.  She is not taking any cholesterol-lowering medication at this time.  She does not consume a low fat diet.  Insomnia: She has difficulty falling and staying asleep.  She takes benadryl or zyquil with some relief of symptoms. She has been on ambien  and trazodone in the past. There is no sleep study on file.  Right carpal tunnel: s/p surgical intervention. She is not taking any medications for this.  She does not follow with neurology.  Osteopenia: She is taking calcium or vitamin D OTC.  She is trying to get some weightbearing exercise.  Bone density from 03/2023 reviewed.  HTN: Her BP today is 142/90. She reports it runs 120/80 at home. She reports she only takes her olmesartan  when she needs it.. There is no ECG on file.  Asthma: She denies chronic cough or shortness of breath. She is taking arnuity ellipta  as prescribed. PFT's from 03/2023 reviewed. She follows with pulmonology.  Past Medical History:  Diagnosis Date   Acid reflux 12/02/2014   Arthritis    Headache    HTN (hypertension) 01/27/2023   Left thyroid  nodule    Muscle spasms of neck 12/02/2014    Current Outpatient Medications  Medication Sig Dispense Refill   fluticasone  (FLOVENT  HFA) 110 MCG/ACT inhaler Inhale 2 puffs into the lungs 2 (two) times daily. 1 each 2   Fluticasone  Furoate (ARNUITY ELLIPTA ) 200 MCG/ACT AEPB Inhale 1 puff into the lungs daily in the afternoon. 30 each 6   olmesartan  (BENICAR ) 20 MG tablet Take 1 tablet (20 mg total) by mouth daily. 90 tablet 1   omeprazole  (PRILOSEC) 20 MG capsule TAKE 1  CAPSULE BY MOUTH EVERY DAY 90 capsule 0   No current facility-administered medications for this visit.    Allergies  Allergen Reactions   Sulfamethoxazole -Trimethoprim      Family History  Problem Relation Age of Onset   Hypertension Mother    Diabetes Mother        non-Insulin dependent   Diabetes Sister    Hypertension Sister    Diabetes Sister    Hypertension Sister    Hypertension Brother    Leukemia Brother    Cancer Brother        kidney   Hypertension Brother    Breast cancer Maternal Aunt    Breast cancer Cousin    Breast cancer Cousin     Social History   Socioeconomic History   Marital status: Widowed    Spouse name: Not on file   Number of children: Not on file   Years of education: Not on file   Highest education level: 12th grade  Occupational History   Not on file  Tobacco Use   Smoking status: Never   Smokeless tobacco: Never  Vaping Use   Vaping status: Never Used  Substance and Sexual Activity   Alcohol use: No   Drug use: No   Sexual activity: Yes  Other Topics Concern   Not on file  Social History Narrative   Not on file   Social Drivers of Health  Financial Resource Strain: Low Risk  (07/27/2023)   Overall Financial Resource Strain (CARDIA)    Difficulty of Paying Living Expenses: Not hard at all  Food Insecurity: No Food Insecurity (07/27/2023)   Hunger Vital Sign    Worried About Running Out of Food in the Last Year: Never true    Ran Out of Food in the Last Year: Never true  Transportation Needs: No Transportation Needs (07/27/2023)   PRAPARE - Administrator, Civil Service (Medical): No    Lack of Transportation (Non-Medical): No  Physical Activity: Sufficiently Active (07/27/2023)   Exercise Vital Sign    Days of Exercise per Week: 5 days    Minutes of Exercise per Session: 30 min  Stress: No Stress Concern Present (07/27/2023)   [employer] of Occupational Health - Occupational Stress Questionnaire     Feeling of Stress: Not at all  Social Connections: Unknown (07/27/2023)   Social Connection and Isolation Panel    Frequency of Communication with Friends and Family: More than three times a week    Frequency of Social Gatherings with Friends and Family: More than three times a week    Attends Religious Services: Patient declined    Database administrator or Organizations: Patient declined    Attends Banker Meetings: Not on file    Marital Status: Widowed  Intimate Partner Violence: Not on file    ROS:  Constitutional: Denies fever, malaise, fatigue, headache or abrupt weight changes.  HEENT: Denies eye pain, eye redness, ear pain, ringing in the ears, wax buildup, runny nose, nasal congestion, bloody nose, or sore throat. Respiratory: Denies difficulty breathing, shortness of breath, cough or sputum production.   Cardiovascular: Denies chest pain, chest tightness, palpitations or swelling in the hands or feet.  Gastrointestinal: Patient reports intermittent epigastric pain.  Denies abdominal pain, bloating, constipation, diarrhea or blood in the stool.  GU: Denies frequency, urgency, pain with urination, blood in urine, odor or discharge. Musculoskeletal: Denies decrease in range of motion, difficulty with gait, muscle pain or joint pain and swelling.  Skin: Denies redness, rashes, lesions or ulcercations.  Neurological: Patient reports insomnia.  Denies dizziness, difficulty with memory, difficulty with speech or problems with balance and coordination.  Psych: Denies anxiety, depression, SI/HI.  No other specific complaints in a complete review of systems (except as listed in HPI above).  PE:  BP (!) 148/90   Ht 5' 7 (1.702 m)   Wt 150 lb 3.2 oz (68.1 kg)   BMI 23.52 kg/m    Wt Readings from Last 3 Encounters:  04/06/23 147 lb (66.7 kg)  02/10/23 149 lb (67.6 kg)  01/27/23 148 lb (67.1 kg)    General: Appears her stated age, well-nourished, well-developed in  NAD. HEENT: Head: normal shape and size; Eyes: sclera white, no icterus, conjunctiva pink, PERRLA and EOMs intact;  Cardiovascular: Normal rate and rhythm. S1,S2 noted.  No murmur, rubs or gallops noted. No JVD or BLE edema. No carotid bruits noted. Pulmonary/Chest: Normal effort and positive vesicular breath sounds. No respiratory distress. No wheezes, rales or ronchi noted.  Abdomen: Mildly tender in the epigastric region.  Normal bowel sounds Musculoskeletal: No difficulty with gait.  Neurological: Alert and oriented.  Coordination normal.  Psychiatric: Mood and affect normal. Behavior is normal. Judgment and thought content normal.    BMET    Component Value Date/Time   NA 141 01/27/2023 0853   NA 142 01/17/2022 1506   K 3.9 01/27/2023 0853  CL 107 01/27/2023 0853   CO2 23 01/27/2023 0853   GLUCOSE 119 01/27/2023 0853   BUN 16 01/27/2023 0853   BUN 12 01/17/2022 1506   CREATININE 0.64 01/27/2023 0853   CALCIUM 9.4 01/27/2023 0853   GFRNONAA 94 05/20/2019 1504   GFRNONAA 87 12/16/2016 1111   GFRAA 108 05/20/2019 1504   GFRAA 101 12/16/2016 1111    Lipid Panel     Component Value Date/Time   CHOL 216 (H) 01/27/2023 0853   CHOL 243 (H) 01/17/2022 1506   TRIG 86 01/27/2023 0853   HDL 64 01/27/2023 0853   HDL 43 01/17/2022 1506   CHOLHDL 3.4 01/27/2023 0853   LDLCALC 133 (H) 01/27/2023 0853    CBC    Component Value Date/Time   WBC 6.8 04/06/2023 1349   RBC 4.94 04/06/2023 1349   HGB 14.3 04/06/2023 1349   HGB 15.4 01/17/2022 1506   HCT 42.1 04/06/2023 1349   HCT 46.0 01/17/2022 1506   PLT 293 04/06/2023 1349   PLT 412 01/17/2022 1506   MCV 85.2 04/06/2023 1349   MCV 87 01/17/2022 1506   MCH 28.9 04/06/2023 1349   MCHC 34.0 04/06/2023 1349   RDW 13.2 04/06/2023 1349   RDW 12.4 01/17/2022 1506   LYMPHSABS 1.8 04/06/2023 1349   LYMPHSABS 1.9 08/20/2020 1140   MONOABS 0.4 04/06/2023 1349   EOSABS 0.1 04/06/2023 1349   EOSABS 0.3 08/20/2020 1140   BASOSABS  0.0 04/06/2023 1349   BASOSABS 0.1 08/20/2020 1140    Hgb A1C Lab Results  Component Value Date   HGBA1C 5.7 (H) 01/27/2023     Assessment and Plan:   RTC in 6 months for your annual exam Angeline Laura, NP

## 2023-08-03 NOTE — Assessment & Plan Note (Signed)
 Continue arnuity ellipta  as previously prescribed She will continue to follow with pulmonology

## 2023-08-04 ENCOUNTER — Ambulatory Visit: Payer: Self-pay | Admitting: Internal Medicine

## 2023-08-25 ENCOUNTER — Ambulatory Visit (INDEPENDENT_AMBULATORY_CARE_PROVIDER_SITE_OTHER)

## 2023-08-25 DIAGNOSIS — Z0000 Encounter for general adult medical examination without abnormal findings: Secondary | ICD-10-CM | POA: Diagnosis not present

## 2023-08-25 NOTE — Patient Instructions (Addendum)
 Ms. Beane , Thank you for taking time out of your busy schedule to complete your Annual Wellness Visit with me. I enjoyed our conversation and look forward to speaking with you again next year. I, as well as your care team,  appreciate your ongoing commitment to your health goals. Please review the following plan we discussed and let me know if I can assist you in the future.   Follow up Visits: Next Medicare AWV with our clinical staff:   08/30/24 @ 8:10 AM BY PHONE Have you seen your provider in the last 6 months (3 months if uncontrolled diabetes)? Yes  Clinician Recommendations:  Aim for 30 minutes of exercise or brisk walking, 6-8 glasses of water, and 5 servings of fruits and vegetables each day. TAKE CARE!      This is a list of the screening recommended for you and due dates:  Health Maintenance  Topic Date Due   Zoster (Shingles) Vaccine (1 of 2) Never done   DTaP/Tdap/Td vaccine (2 - Td or Tdap) 09/06/2022   Pneumococcal Vaccine for age over 71 (1 of 2 - PCV) 10/27/2023*   Mammogram  04/05/2024   Medicare Annual Wellness Visit  08/24/2024   DEXA scan (bone density measurement)  04/05/2028   Colon Cancer Screening  10/01/2031   Hepatitis C Screening  Completed   Hepatitis B Vaccine  Aged Out   HPV Vaccine  Aged Out   Meningitis B Vaccine  Aged Out   Flu Shot  Discontinued   COVID-19 Vaccine  Discontinued  *Topic was postponed. The date shown is not the original due date.    Advanced directives: (ACP Link)Information on Advanced Care Planning can be found at [place]  Secretary of [facility] Advance Health Care Directives Advance Health Care Directives. [email or web address]  Advance Care Planning is important because it:  [x]  Makes sure you receive the medical care that is consistent with your values, goals, and preferences  [x]  It provides guidance to your family and loved ones and reduces their decisional burden about whether or not they are making the right decisions based on your  wishes.  Follow the link provided in your after visit summary or read over the paperwork we have mailed to you to help you started getting your Advance Directives in place. If you need assistance in completing these, please reach out to us  so that we can help you!

## 2023-08-25 NOTE — Progress Notes (Signed)
 Subjective:   Ashley Wallace is a 68 y.o. who presents for a Medicare Wellness preventive visit.  As a reminder, Annual Wellness Visits don't include a physical exam, and some assessments may be limited, especially if this visit is performed virtually. We may recommend an in-person follow-up visit with your provider if needed.  Visit Complete: Virtual I connected with  Ashley Wallace on 08/25/23 by a audio enabled telemedicine application and verified that I am speaking with the correct person using two identifiers.  Patient Location: Home  Provider Location: Home Office  I discussed the limitations of evaluation and management by telemedicine. The patient expressed understanding and agreed to proceed.  Vital Signs: Because this visit was a virtual/telehealth visit, some criteria may be missing or patient reported. Any vitals not documented were not able to be obtained and vitals that have been documented are patient reported.  VideoDeclined- This patient declined Librarian, academic. Therefore the visit was completed with audio only.  Persons Participating in Visit: Patient.  AWV Questionnaire: No: Patient Medicare AWV questionnaire was not completed prior to this visit.  Cardiac Risk Factors include: advanced age (>58men, >73 women);hypertension;dyslipidemia     Objective:    There were no vitals filed for this visit. There is no height or weight on file to calculate BMI.     08/25/2023    8:58 AM 09/30/2021    6:58 AM 02/18/2018    1:39 AM 12/04/2014    2:14 PM 07/08/2014    7:17 AM  Advanced Directives  Does Patient Have a Medical Advance Directive? No No No  No  No   Would patient like information on creating a medical advance directive? No - Patient declined No - Patient declined   No - patient declined information      Data saved with a previous flowsheet row definition    Current Medications (verified) Outpatient Encounter  Medications as of 08/25/2023  Medication Sig   CALCIUM-VITAMIN D PO Take by mouth daily.   Fluticasone  Furoate (ARNUITY ELLIPTA ) 200 MCG/ACT AEPB Inhale 1 puff into the lungs daily in the afternoon.   olmesartan  (BENICAR ) 20 MG tablet Take 1 tablet (20 mg total) by mouth daily.   omeprazole  (PRILOSEC) 20 MG capsule TAKE 1 CAPSULE BY MOUTH EVERY DAY   traZODone  (DESYREL ) 50 MG tablet Take 0.5-1 tablets (25-50 mg total) by mouth at bedtime as needed for sleep.   No facility-administered encounter medications on file as of 08/25/2023.    Allergies (verified) Sulfamethoxazole -trimethoprim    History: Past Medical History:  Diagnosis Date   Acid reflux 12/02/2014   Arthritis    Asthma 08/03/2023   Headache    HTN (hypertension) 01/27/2023   Left thyroid  nodule    Muscle spasms of neck 12/02/2014   Past Surgical History:  Procedure Laterality Date   CHOLECYSTECTOMY  1999   COLONOSCOPY WITH PROPOFOL  N/A 09/30/2021   Procedure: COLONOSCOPY WITH PROPOFOL ;  Surgeon: Therisa Bi, MD;  Location[facility] ENDOSCOPY;  Service: Gastroenterology;  Laterality: N/A;   tosillectomy     TUBAL LIGATION     Family History  Problem Relation Age of Onset   Hypertension Mother    Diabetes Mother        non-Insulin dependent   Diabetes Sister    Hypertension Sister    Diabetes Sister    Hypertension Sister    Hypertension Brother    Leukemia Brother    Cancer Brother        kidney  Hypertension Brother    Breast cancer Maternal Aunt    Breast cancer Cousin    Breast cancer Cousin    Social History   Socioeconomic History   Marital status: Widowed    Spouse name: Not on file   Number of children: Not on file   Years of education: Not on file   Highest education level: 12th grade  Occupational History   Not on file  Tobacco Use   Smoking status: Never   Smokeless tobacco: Never  Vaping Use   Vaping status: Never Used  Substance and Sexual Activity   Alcohol use: No   Drug use: No    Sexual activity: Yes  Other Topics Concern   Not on file  Social History Narrative   Not on file   Social Drivers of Health   Financial Resource Strain: Low Risk  (08/25/2023)   Overall Financial Resource Strain (CARDIA)    Difficulty of Paying Living Expenses: Not hard at all  Food Insecurity: No Food Insecurity (08/25/2023)   Hunger Vital Sign    Worried About Running Out of Food in the Last Year: Never true    Ran Out of Food in the Last Year: Never true  Transportation Needs: No Transportation Needs (08/25/2023)   PRAPARE - Administrator, Civil Service (Medical): No    Lack of Transportation (Non-Medical): No  Physical Activity: Sufficiently Active (08/25/2023)   Exercise Vital Sign    Days of Exercise per Week: 5 days    Minutes of Exercise per Session: 30 min  Stress: No Stress Concern Present (08/25/2023)   [employer] of Occupational Health - Occupational Stress Questionnaire    Feeling of Stress: Not at all  Social Connections: Socially Isolated (08/25/2023)   Social Connection and Isolation Panel    Frequency of Communication with Friends and Family: More than three times a week    Frequency of Social Gatherings with Friends and Family: More than three times a week    Attends Religious Services: Never    Database administrator or Organizations: No    Attends Banker Meetings: Never    Marital Status: Widowed    Tobacco Counseling Counseling given: Not Answered    Clinical Intake:  Pre-visit preparation completed: Yes  Pain : No/denies pain     BMI - recorded: 23.5 Nutritional Status: BMI of 19-24  Normal Nutritional Risks: None Diabetes: No  Lab Results  Component Value Date   HGBA1C 5.7 (H) 08/03/2023   HGBA1C 5.7 (H) 01/27/2023   HGBA1C 5.7 (H) 01/17/2022     How often do you need to have someone help you when you read instructions, pamphlets, or other written materials from your doctor or pharmacy?: 1 -  Never  Interpreter Needed?: No  Information entered by :: JHONNIE DAS, LPN   Activities of Daily Living    08/25/2023    8:59 AM 08/24/2023    5:01 PM  In your present state of health, do you have any difficulty performing the following activities:  Hearing? 0 0  Vision? 0 0  Difficulty concentrating or making decisions? 0 0  Walking or climbing stairs? 0 0  Dressing or bathing? 0 0  Doing errands, shopping? 0 0  Preparing Food and eating ? N N  Using the Toilet? N N  In the past six months, have you accidently leaked urine? N N  Do you have problems with loss of bowel control? N N  Managing your  Medications? N N  Managing your Finances? N N  Housekeeping or managing your Housekeeping? N N    Patient Care Team: Antonette Angeline ORN, NP as PCP - [facility]  I have updated your Care Teams any recent Medical Services you may have received from other providers in the past year.     Assessment:   This is a routine wellness examination for [facility].  Hearing/Vision screen Hearing Screening - Comments:: NO AIDS Vision Screening - Comments:: READERS- DR.WOODARD- HAD EXAM IN MAY   Goals Addressed             This Visit's Progress    DIET - EAT MORE FRUITS AND VEGETABLES         Depression Screen     08/25/2023    8:56 AM 08/03/2023    8:12 AM 02/10/2023   10:48 AM 01/27/2023    8:51 AM 10/27/2022   11:17 AM 01/17/2022    2:26 PM 08/20/2020    9:16 AM  PHQ 2/9 Scores  PHQ - 2 Score 0 0 0 0 0 0 0  PHQ- 9 Score 0 3   5 0     Fall Risk     08/24/2023    5:01 PM 08/03/2023    8:12 AM 04/06/2023   10:56 AM 02/10/2023   10:48 AM 01/27/2023    8:51 AM  Fall Risk   Falls in the past year? 0 0 0 0 0  Number falls in past yr: 0      Injury with Fall? 0      Risk for fall due to : No Fall Risks      Follow up Falls evaluation completed;Falls prevention discussed        MEDICARE RISK AT HOME:  Medicare Risk at Home Any stairs  in or around the home?: Yes If so, are there any without handrails?: No Home free of loose throw rugs in walkways, pet beds, electrical cords, etc?: Yes Adequate lighting in your home to reduce risk of falls?: Yes Life alert?: No Use of a cane, walker or w/c?: No Grab bars in the bathroom?: No Shower chair or bench in shower?: Yes Elevated toilet seat or a handicapped toilet?: Yes  TIMED UP AND GO:  Was the test performed?  No  Cognitive Function: 6CIT completed        08/25/2023    9:00 AM  6CIT Screen  What Year? 0 points  What month? 0 points  What time? 0 points  Count back from 20 0 points  Months in reverse 0 points  Repeat phrase 0 points  Total Score 0 points    Immunizations Immunization History  Administered Date(s) Administered   Tdap 09/05/2012    Screening Tests Health Maintenance  Topic Date Due   Zoster Vaccines- Shingrix (1 of 2) Never done   DTaP/Tdap/Td (2 - Td or Tdap) 09/06/2022   Pneumococcal Vaccine: 50+ Years (1 of 2 - PCV) 10/27/2023 (Originally 07/28/1974)   MAMMOGRAM  04/05/2024   Medicare Annual Wellness (AWV)  08/24/2024   DEXA SCAN  04/05/2028   Colonoscopy  10/01/2031   Hepatitis C Screening  Completed   Hepatitis B Vaccines  Aged Out   HPV VACCINES  Aged Out   Meningococcal B Vaccine  Aged Out   INFLUENZA VACCINE  Discontinued   COVID-19 Vaccine  Discontinued    Health Maintenance  Health Maintenance Due  Topic Date Due   Zoster Vaccines-  Shingrix (1 of 2) Never done   DTaP/Tdap/Td (2 - Td or Tdap) 09/06/2022   Health Maintenance Items Addressed: UP TO DATE ON MAMMOGRAM, COLONOSCOPY & BDS; NEEDS TDAP- DECLINES ALL OTHER VACCINES  Additional Screening:  Vision Screening: Recommended annual ophthalmology exams for early detection of glaucoma and other disorders of the eye. Would you like a referral to an eye doctor? No    Dental Screening: Recommended annual dental exams for proper oral hygiene  Community Resource  Referral / Chronic Care Management: CRR required this visit?  No   CCM required this visit?  No   Plan:    I have personally reviewed and noted the following in the patient's chart:   Medical and social history Use of alcohol, tobacco or illicit drugs  Current medications and supplements including opioid prescriptions. Patient is not currently taking opioid prescriptions. Functional ability and status Nutritional status Physical activity Advanced directives List of other physicians Hospitalizations, surgeries, and ER visits in previous 12 months Vitals Screenings to include cognitive, depression, and falls Referrals and appointments  In addition, I have reviewed and discussed with patient certain preventive protocols, quality metrics, and best practice recommendations. A written personalized care plan for preventive services as well as general preventive health recommendations were provided to patient.   Jhonnie GORMAN Das, LPN   [id number]/81/7974   After Visit Summary: (MyChart) Due to this being a telephonic visit, the after visit summary with patients personalized plan was offered to patient via MyChart   Notes: Nothing significant to report at this time.

## 2023-09-16 ENCOUNTER — Other Ambulatory Visit: Payer: Self-pay | Admitting: Internal Medicine

## 2023-09-19 NOTE — Telephone Encounter (Signed)
 Requested Prescriptions  Pending Prescriptions Disp Refills   omeprazole  (PRILOSEC) 20 MG capsule [Pharmacy Med Name: OMEPRAZOLE  DR 20 MG CAPSULE] 90 capsule 1    Sig: TAKE 1 CAPSULE BY MOUTH EVERY DAY     Gastroenterology: Proton Pump Inhibitors Passed - 09/19/2023  4:21 PM      Passed - Valid encounter within last 12 months    Recent Outpatient Visits           1 month ago Pre-diabetes   [place] [facility] [place], Angeline ORN, [place]

## 2023-09-20 DIAGNOSIS — K080 Exfoliation of teeth due to systemic causes: Secondary | ICD-10-CM | POA: Diagnosis not present

## 2023-10-06 DIAGNOSIS — S96911A Strain of unspecified muscle and tendon at ankle and foot level, right foot, initial encounter: Secondary | ICD-10-CM | POA: Diagnosis not present

## 2023-10-30 ENCOUNTER — Other Ambulatory Visit: Payer: Self-pay | Admitting: Internal Medicine

## 2023-10-30 ENCOUNTER — Other Ambulatory Visit: Payer: Self-pay | Admitting: Pulmonary Disease

## 2023-10-30 DIAGNOSIS — J4530 Mild persistent asthma, uncomplicated: Secondary | ICD-10-CM

## 2023-10-30 DIAGNOSIS — J4540 Moderate persistent asthma, uncomplicated: Secondary | ICD-10-CM

## 2023-10-30 DIAGNOSIS — R0602 Shortness of breath: Secondary | ICD-10-CM

## 2023-10-30 NOTE — Telephone Encounter (Signed)
 Called pt and lvm to schedule follow up and clarify which medication she is taking -- Flovent  vs Arnuity.

## 2023-10-31 NOTE — Progress Notes (Signed)
 ELLAKATE GONSALVES                                          MRN: [id number]   10/31/2023   The [facility] Quality Team Specialist reviewed this patient medical record for the purposes of chart review for care gap closure. The following were reviewed: chart review for care gap closure-controlling blood pressure.    VBCI Quality Team

## 2023-10-31 NOTE — Telephone Encounter (Signed)
 Requested Prescriptions  Pending Prescriptions Disp Refills   olmesartan  (BENICAR ) 20 MG tablet [Pharmacy Med Name: OLMESARTAN  MEDOXOMIL 20 MG TAB] 90 tablet 1    Sig: TAKE 1 TABLET BY MOUTH EVERY DAY     Cardiovascular:  Angiotensin Receptor Blockers Failed - 10/31/2023  2:45 PM      Failed - Last BP in normal range    BP Readings from Last 1 Encounters:  08/03/23 (!) 148/90         Passed - Cr in normal range and within 180 days    Creat  Date Value Ref Range Status  08/03/2023 0.66 0.50 - 1.05 mg/dL Final         Passed - K in normal range and within 180 days    Potassium  Date Value Ref Range Status  08/03/2023 4.1 3.5 - 5.3 mmol/L Final         Passed - Patient is not pregnant      Passed - Valid encounter within last 6 months    Recent Outpatient Visits           2 months ago Pre-diabetes   [place] [facility] [place], Angeline ORN, [place]

## 2024-01-15 ENCOUNTER — Encounter: Payer: Self-pay | Admitting: Pulmonary Disease

## 2024-01-15 ENCOUNTER — Ambulatory Visit: Admitting: Pulmonary Disease

## 2024-01-15 VITALS — BP 118/80 | HR 96 | Temp 98.6°F | Ht 67.0 in | Wt 152.8 lb

## 2024-01-15 DIAGNOSIS — K219 Gastro-esophageal reflux disease without esophagitis: Secondary | ICD-10-CM

## 2024-01-15 DIAGNOSIS — U099 Post covid-19 condition, unspecified: Secondary | ICD-10-CM | POA: Diagnosis not present

## 2024-01-15 DIAGNOSIS — R0602 Shortness of breath: Secondary | ICD-10-CM

## 2024-01-15 DIAGNOSIS — J45909 Unspecified asthma, uncomplicated: Secondary | ICD-10-CM | POA: Diagnosis not present

## 2024-01-15 LAB — NITRIC OXIDE: Nitric Oxide: 11

## 2024-01-15 MED ORDER — ALBUTEROL SULFATE HFA 108 (90 BASE) MCG/ACT IN AERS: 1.0000 | INHALATION_SPRAY | Freq: Four times a day (QID) | RESPIRATORY_TRACT | 6 refills | Status: AC | PRN

## 2024-01-15 NOTE — Progress Notes (Signed)
 Synopsis: Referred in by Antonette Angeline ORN, NP   Subjective:   PATIENT ID: Ashley Wallace GENDER: female DOB: Mar 03, 1955, MRN: [id number]  Chief Complaint  Patient presents with   Shortness of Breath    SOB. No wheezing or cough.  Arnuity- daily does help with her breathing. Albuterol - PRN    HPI Ashley Wallace is a 68 year old female patient with a past medical history of GERD presenting today to the pulmonary clinic for ongoing cough and shortness of breath on exertion.  She reports that she contracted COVID-19 last year and afterwards she started having intermittent cough dry throughout the day.  She contracted COVID-19 again in October 2024 and afterwards her cough has worsened along with shortness of breath on exertion.  She denies wheezing or chest tightness.  She denies a diagnosis of asthma.  She was never hospitalized for breathing issues.  She was prescribed prednisone  as outpatient as well as albuterol  as needed which helped a little.  She does have GERD and takes over-the-counter Prilosec.  Started her on Symbicort  during the last visit but felt that it increased her blood pressure and therefore started on Arnuity.   Reports ongoing cough today with nasal congestion. Possibly Allergies vs Viral infection.   PFTs 2025 overall normal but with reduced PEFR.   Family history -father with lung cancer and emphysema was a smoker.  Social history -never smoker denies illicit drug use lives at home by herself.  Has 2 kids who are healthy.  OV 01/15/2024 - Ashley Wallace is here to follow-up on post-COVID cough.  Diagnosed with post-COVID reactive airway disease based on decreased peak expiratory flow rate on PFTs, Feno of 62 and symptomatology.  Started her on Breo Ellipta  during last visit with good response.  She denies any cough but still reports some shortness of breath specifically with going uphill.  She has not used her rescue inhaler for a while.  Her Feno is 11 today  indicating good response and compliance to Breo Ellipta .    ROS All systems were reviewed and are negative except for the above Objective:   Vitals:   01/15/24 0935  BP: 118/80  Pulse: 96  Temp: 98.6 F (37 C)  SpO2: 97%  Weight: 152 lb 12.8 oz (69.3 kg)  Height: 5' 7 (1.702 m)    97% on RA BMI Readings from Last 3 Encounters:  01/15/24 23.93 kg/m  08/03/23 23.52 kg/m  04/06/23 23.02 kg/m   Wt Readings from Last 3 Encounters:  01/15/24 152 lb 12.8 oz (69.3 kg)  08/03/23 150 lb 3.2 oz (68.1 kg)  04/06/23 147 lb (66.7 kg)    Physical Exam GEN: NAD, Healthy Appearing HEENT: Supple Neck, Reactive Pupils, EOMI  CVS: Normal S1, Normal S2, RRR, No murmurs or ES appreciated  Lungs: Clear bilateral air entry.  Abdomen: Soft, non tender, non distended, + BS  Extremities: Warm and well perfused, No edema   Labs and imaging were reviewed. Ancillary Information   CBC    Component Value Date/Time   WBC 5.9 08/03/2023 0812   RBC 5.02 08/03/2023 0812   HGB 14.3 08/03/2023 0812   HGB 15.4 01/17/2022 1506   HCT 44.4 08/03/2023 0812   HCT 46.0 01/17/2022 1506   PLT 315 08/03/2023 0812   PLT 412 01/17/2022 1506   MCV 88.4 08/03/2023 0812   MCV 87 01/17/2022 1506   MCH 28.5 08/03/2023 0812   MCHC 32.2 08/03/2023 0812   RDW 12.5 08/03/2023 0812   RDW  12.4 01/17/2022 1506   LYMPHSABS 1.8 04/06/2023 1349   LYMPHSABS 1.9 08/20/2020 1140   MONOABS 0.4 04/06/2023 1349   EOSABS 0.1 04/06/2023 1349   EOSABS 0.3 08/20/2020 1140   BASOSABS 0.0 04/06/2023 1349   BASOSABS 0.1 08/20/2020 1140       Latest Ref Rng & Units 04/06/2023   10:51 AM  PFT Results  FVC-Pre L 2.70   FVC-Predicted Pre % 92   FVC-Post L 2.67   FVC-Predicted Post % 91   Pre FEV1/FVC % % 81   Post FEV1/FCV % % 83   FEV1-Pre L 2.18   FEV1-Predicted Pre % 97   FEV1-Post L 2.22   DLCO uncorrected ml/min/mmHg 18.27   DLCO UNC% % 98   DLVA Predicted % 96   TLC L 5.00   TLC % Predicted % 105   RV %  Predicted % 115      Assessment & Plan:  Ashley Wallace is a 68 year old female patient with a past medical history of GERD presenting today to the pulmonary clinic for ongoing cough and shortness of breath on exertion.  # Post-COVID reactive airway disease/adult onset asthma Feno 62 indicating intrapulmonary eosinophilic inflammation Fino 11 87/1 indicating good compliance to Breo Ellipta .  []  c/w Breo Ellipta  1 puff daily. Advised mouth rinsing afterwards. []  C.w Albuterol  as needed.   # GERD Continue Prilosec 20 mg daily.  #Allergic rhinitis  C.w Zyrtec and Flonase .   RTC 6 months I personally spent a total of 30 minutes in the care of the patient today including preparing to see the patient, getting/reviewing separately obtained history, performing a medically appropriate exam/evaluation, counseling and educating, placing orders, documenting clinical information in the EHR, independently interpreting results, and communicating results.   Darrin Barn, MD [facility] 01/15/2024 10:13 AM

## 2024-01-27 ENCOUNTER — Other Ambulatory Visit: Payer: Self-pay | Admitting: Internal Medicine

## 2024-01-29 NOTE — Progress Notes (Unsigned)
 "  Subjective:    Patient ID: Ashley Wallace, female    DOB: 19-Sep-1955, 68 y.o.   MRN: [id number]  HPI  Patient presents to clinic today for her annual exam.   Flu: never Tetanus: 08/2012 COVID: never Pneumovax: never Prevnar: never Shingrix: never Pap smear: 02/2018 Mammogram: 04/2023 Bone density: 03/2023 Colon screening: 09/2021 Vision screening: annually Dentist: biannually  Diet: She does eat meat. She consumes fruits and veggies. She tries to avoid fried foods. She drinks mostly water. Exercise: Walking   Review of Systems   Past Medical History:  Diagnosis Date   Acid reflux 12/02/2014   Arthritis    Asthma 08/03/2023   Headache    HTN (hypertension) 01/27/2023   Left thyroid  nodule    Muscle spasms of neck 12/02/2014    Current Outpatient Medications  Medication Sig Dispense Refill   albuterol  (VENTOLIN  HFA) 108 (90 Base) MCG/ACT inhaler Inhale 1-2 puffs into the lungs every 6 (six) hours as needed. 1 each 6   CALCIUM -VITAMIN D PO Take by mouth daily.     Fluticasone  Furoate (ARNUITY ELLIPTA ) 200 MCG/ACT AEPB INHALE 1 PUFF INTO THE LUNGS DAILY IN THE AFTERNOON. 30 each 3   olmesartan  (BENICAR ) 20 MG tablet TAKE 1 TABLET BY MOUTH EVERY DAY 90 tablet 1   omeprazole  (PRILOSEC) 20 MG capsule TAKE 1 CAPSULE BY MOUTH EVERY DAY 90 capsule 1   traZODone  (DESYREL ) 50 MG tablet Take 0.5-1 tablets (25-50 mg total) by mouth at bedtime as needed for sleep. 90 tablet 1   No current facility-administered medications for this visit.    Allergies  Allergen Reactions   Sulfamethoxazole -Trimethoprim  Dermatitis    Family History  Problem Relation Age of Onset   Hypertension Mother    Diabetes Mother        non-Insulin dependent   Diabetes Sister    Hypertension Sister    Diabetes Sister    Hypertension Sister    Hypertension Brother    Leukemia Brother    Cancer Brother        kidney   Hypertension Brother    Breast cancer Maternal Aunt    Breast cancer Cousin     Breast cancer Cousin     Social History   Socioeconomic History   Marital status: Widowed    Spouse name: Not on file   Number of children: Not on file   Years of education: Not on file   Highest education level: 12th grade  Occupational History   Not on file  Tobacco Use   Smoking status: Never   Smokeless tobacco: Never  Vaping Use   Vaping status: Never Used  Substance and Sexual Activity   Alcohol use: No   Drug use: No   Sexual activity: Yes  Other Topics Concern   Not on file  Social History Narrative   Not on file   Social Drivers of Health   Tobacco Use: Low Risk (01/15/2024)   Patient History    Smoking Tobacco Use: Never    Smokeless Tobacco Use: Never    Passive Exposure: Not on file  Financial Resource Strain: Low Risk (01/26/2024)   Overall Financial Resource Strain (CARDIA)    Difficulty of Paying Living Expenses: Not hard at all  Food Insecurity: No Food Insecurity (01/26/2024)   Epic    Worried About Programme Researcher, Broadcasting/film/video in the Last Year: Never true    Ran Out of Food in the Last Year: Never true  Transportation Needs: No Transportation  Needs (01/26/2024)   Epic    Lack of Transportation (Medical): No    Lack of Transportation (Non-Medical): No  Physical Activity: Sufficiently Active (01/26/2024)   Exercise Vital Sign    Days of Exercise per Week: 6 days    Minutes of Exercise per Session: 30 min  Stress: No Stress Concern Present (01/26/2024)   [employer] of Occupational Health - Occupational Stress Questionnaire    Feeling of Stress: Only a little  Social Connections: Socially Isolated (01/26/2024)   Social Connection and Isolation Panel    Frequency of Communication with Friends and Family: More than three times a week    Frequency of Social Gatherings with Friends and Family: More than three times a week    Attends Religious Services: Patient declined    Database Administrator or Organizations: No    Attends Banker  Meetings: Not on file    Marital Status: Widowed  Intimate Partner Violence: Not At Risk (08/25/2023)   Epic    Fear of Current or Ex-Partner: No    Emotionally Abused: No    Physically Abused: No    Sexually Abused: No  Depression (PHQ2-9): Low Risk (08/25/2023)   Depression (PHQ2-9)    PHQ-2 Score: 0  Alcohol Screen: Low Risk (08/25/2023)   Alcohol Screen    Last Alcohol Screening Score (AUDIT): 0  Housing: Unknown (01/26/2024)   Epic    Unable to Pay for Housing in the Last Year: Patient declined    Number of Times Moved in the Last Year: Not on file    Homeless in the Last Year: No  Utilities: Not At Risk (08/25/2023)   Epic    Threatened with loss of utilities: No  Health Literacy: Adequate Health Literacy (08/25/2023)   B1300 Health Literacy    Frequency of need for help with medical instructions: Never     Constitutional: Denies fever, malaise, fatigue, headache or abrupt weight changes.  HEENT: Denies eye pain, eye redness, ear pain, ringing in the ears, wax buildup, runny nose, nasal congestion, bloody nose, or sore throat. Respiratory: Denies difficulty breathing, shortness of breath, cough or sputum production.   Cardiovascular: Denies chest pain, chest tightness, palpitations or swelling in the hands or feet.  Gastrointestinal: Pt reports intermittent constipation. Denies abdominal pain, bloating, diarrhea or blood in the stool.  GU: Denies urgency, frequency, pain with urination, burning sensation, blood in urine, odor or discharge. Musculoskeletal: Denies decrease in range of motion, difficulty with gait, muscle pain or joint pain and swelling.  Skin: Denies redness, rashes, lesions or ulcercations.  Neurological: Patient reports insomnia, paresthesia in hands.  Denies dizziness, difficulty with memory, difficulty with speech or problems with balance and coordination.  Psych: Denies anxiety, depression, SI/HI.  No other specific complaints in a complete review of  systems (except as listed in HPI above).      Objective:   Physical Exam BP 130/88 (BP Location: Right Arm, Patient Position: Sitting, Cuff Size: Normal)   Pulse 86   Ht 5' 7 (1.702 m)   Wt 152 lb 9.6 oz (69.2 kg)   SpO2 100%   BMI 23.90 kg/m    Wt Readings from Last 3 Encounters:  01/15/24 152 lb 12.8 oz (69.3 kg)  08/03/23 150 lb 3.2 oz (68.1 kg)  04/06/23 147 lb (66.7 kg)    General: Appears her stated age, well developed, well nourished, in NAD. Skin: Warm, dry and intact.  HEENT: Head: normal shape and size; Eyes: sclera white,  no icterus, conjunctiva pink, PERRLA and EOMs intact;  Neck:  Neck supple, trachea midline. No masses, lumps or thyromegaly present.  Cardiovascular: Normal rate and rhythm. S1,S2 noted.  No murmur, rubs or gallops noted. No JVD or BLE edema. No carotid bruits noted. Pulmonary/Chest: Normal effort and positive vesicular breath sounds. No respiratory distress. No wheezes, rales or ronchi noted.  Abdomen: Soft and nontender. Normal bowel sounds.  Musculoskeletal: Strength 5/5 BUE/BLE. No difficulty with gait.  Neurological: Alert and oriented. Cranial nerves II-XII grossly intact. Coordination normal.  Psychiatric: Mood and affect normal. Behavior is normal. Judgment and thought content normal.    BMET    Component Value Date/Time   NA 140 08/03/2023 0812   NA 142 01/17/2022 1506   K 4.1 08/03/2023 0812   CL 105 08/03/2023 0812   CO2 27 08/03/2023 0812   GLUCOSE 100 08/03/2023 0812   BUN 15 08/03/2023 0812   BUN 12 01/17/2022 1506   CREATININE 0.66 08/03/2023 0812   CALCIUM  9.6 08/03/2023 0812   GFRNONAA 94 05/20/2019 1504   GFRNONAA 87 12/16/2016 1111   GFRAA 108 05/20/2019 1504   GFRAA 101 12/16/2016 1111    Lipid Panel     Component Value Date/Time   CHOL 226 (H) 08/03/2023 0812   CHOL 243 (H) 01/17/2022 1506   TRIG 149 08/03/2023 0812   HDL 63 08/03/2023 0812   HDL 43 01/17/2022 1506   CHOLHDL 3.6 08/03/2023 0812   LDLCALC  135 (H) 08/03/2023 0812    CBC    Component Value Date/Time   WBC 5.9 08/03/2023 0812   RBC 5.02 08/03/2023 0812   HGB 14.3 08/03/2023 0812   HGB 15.4 01/17/2022 1506   HCT 44.4 08/03/2023 0812   HCT 46.0 01/17/2022 1506   PLT 315 08/03/2023 0812   PLT 412 01/17/2022 1506   MCV 88.4 08/03/2023 0812   MCV 87 01/17/2022 1506   MCH 28.5 08/03/2023 0812   MCHC 32.2 08/03/2023 0812   RDW 12.5 08/03/2023 0812   RDW 12.4 01/17/2022 1506   LYMPHSABS 1.8 04/06/2023 1349   LYMPHSABS 1.9 08/20/2020 1140   MONOABS 0.4 04/06/2023 1349   EOSABS 0.1 04/06/2023 1349   EOSABS 0.3 08/20/2020 1140   BASOSABS 0.0 04/06/2023 1349   BASOSABS 0.1 08/20/2020 1140    Hgb A1C Lab Results  Component Value Date   HGBA1C 5.7 (H) 08/03/2023            Assessment & Plan:   Preventative health maintenance:  She declines flu shot today She declines tetanus for financial reasons, advised her if she gets better cut to go get this done Encouraged her to get her COVID-vaccine She declines pneumonia vaccination Discussed Shingrix vaccine, she will check coverage with her insurance company and schedule visit if she would like to have this done She no longer needs to screen for cervical cancer Mammogram ordered-she will call to schedule Bone density UTD Colon screening UTD Encouraged her to consume a balanced diet and exercise regimen Advised her to see an eye doctor and dentist annually We will check CBC, c-Met, lipid, A1c today  RTC in 6 months, follow-up chronic conditions Angeline Laura, NP  "

## 2024-01-30 ENCOUNTER — Ambulatory Visit: Admitting: Internal Medicine

## 2024-01-30 ENCOUNTER — Encounter: Payer: Self-pay | Admitting: Internal Medicine

## 2024-01-30 DIAGNOSIS — Z1231 Encounter for screening mammogram for malignant neoplasm of breast: Secondary | ICD-10-CM

## 2024-01-30 DIAGNOSIS — E7800 Pure hypercholesterolemia, unspecified: Secondary | ICD-10-CM

## 2024-01-30 DIAGNOSIS — R7303 Prediabetes: Secondary | ICD-10-CM

## 2024-01-30 DIAGNOSIS — Z0001 Encounter for general adult medical examination with abnormal findings: Secondary | ICD-10-CM

## 2024-01-30 MED ORDER — TRAZODONE HCL 50 MG PO TABS: 25.0000 mg | ORAL_TABLET | Freq: Every evening | ORAL | 1 refills | Status: AC | PRN

## 2024-01-30 MED ORDER — OMEPRAZOLE 20 MG PO CPDR: 20.0000 mg | DELAYED_RELEASE_CAPSULE | Freq: Every day | ORAL | 1 refills | Status: DC

## 2024-01-30 MED ORDER — OLMESARTAN MEDOXOMIL 20 MG PO TABS: 20.0000 mg | ORAL_TABLET | Freq: Every day | ORAL | 0 refills | Status: AC

## 2024-01-30 NOTE — Patient Instructions (Signed)
 Health Maintenance for Postmenopausal Women Menopause is a normal process in which your ability to get pregnant comes to an end. This process happens slowly over many months or years, usually between the ages of 76 and 38. Menopause is complete when you have missed your menstrual period for 12 months. It is important to talk with your health care provider about some of the most common conditions that affect women after menopause (postmenopausal women). These include heart disease, cancer, and bone loss (osteoporosis). Adopting a healthy lifestyle and getting preventive care can help to promote your health and wellness. The actions you take can also lower your chances of developing some of these common conditions. What are the signs and symptoms of menopause? During menopause, you may have the following symptoms: Hot flashes. These can be moderate or severe. Night sweats. Decrease in sex drive. Mood swings. Headaches. Tiredness (fatigue). Irritability. Memory problems. Problems falling asleep or staying asleep. Talk with your health care provider about treatment options for your symptoms. Do I need hormone replacement therapy? Hormone replacement therapy is effective in treating symptoms that are caused by menopause, such as hot flashes and night sweats. Hormone replacement carries certain risks, especially as you become older. If you are thinking about using estrogen or estrogen with progestin, discuss the benefits and risks with your health care provider. How can I reduce my risk for heart disease and stroke? The risk of heart disease, heart attack, and stroke increases as you age. One of the causes may be a change in the body's hormones during menopause. This can affect how your body uses dietary fats, triglycerides, and cholesterol. Heart attack and stroke are medical emergencies. There are many things that you can do to help prevent heart disease and stroke. Watch your blood pressure High  blood pressure causes heart disease and increases the risk of stroke. This is more likely to develop in people who have high blood pressure readings or are overweight. Have your blood pressure checked: Every 3-5 years if you are 32-23 years of age. Every year if you are 31 years old or older. Eat a healthy diet  Eat a diet that includes plenty of vegetables, fruits, low-fat dairy products, and lean protein. Do not eat a lot of foods that are high in solid fats, added sugars, or sodium. Get regular exercise Get regular exercise. This is one of the most important things you can do for your health. Most adults should: Try to exercise for at least 150 minutes each week. The exercise should increase your heart rate and make you sweat (moderate-intensity exercise). Try to do strengthening exercises at least twice each week. Do these in addition to the moderate-intensity exercise. Spend less time sitting. Even light physical activity can be beneficial. Other tips Work with your health care provider to achieve or maintain a healthy weight. Do not use any products that contain nicotine or tobacco. These products include cigarettes, chewing tobacco, and vaping devices, such as e-cigarettes. If you need help quitting, ask your health care provider. Know your numbers. Ask your health care provider to check your cholesterol and your blood sugar (glucose). Continue to have your blood tested as directed by your health care provider. Do I need screening for cancer? Depending on your health history and family history, you may need to have cancer screenings at different stages of your life. This may include screening for: Breast cancer. Cervical cancer. Lung cancer. Colorectal cancer. What is my risk for osteoporosis? After menopause, you may be  at increased risk for osteoporosis. Osteoporosis is a condition in which bone destruction happens more quickly than new bone creation. To help prevent osteoporosis or  the bone fractures that can happen because of osteoporosis, you may take the following actions: If you are 24-54 years old, get at least 1,000 mg of calcium and at least 600 international units (IU) of vitamin D  per day. If you are older than age 75 but younger than age 30, get at least 1,200 mg of calcium and at least 600 international units (IU) of vitamin D  per day. If you are older than age 8, get at least 1,200 mg of calcium and at least 800 international units (IU) of vitamin D  per day. Smoking and drinking excessive alcohol increase the risk of osteoporosis. Eat foods that are rich in calcium and vitamin D , and do weight-bearing exercises several times each week as directed by your health care provider. How does menopause affect my mental health? Depression may occur at any age, but it is more common as you become older. Common symptoms of depression include: Feeling depressed. Changes in sleep patterns. Changes in appetite or eating patterns. Feeling an overall lack of motivation or enjoyment of activities that you previously enjoyed. Frequent crying spells. Talk with your health care provider if you think that you are experiencing any of these symptoms. General instructions See your health care provider for regular wellness exams and vaccines. This may include: Scheduling regular health, dental, and eye exams. Getting and maintaining your vaccines. These include: Influenza vaccine. Get this vaccine each year before the flu season begins. Pneumonia vaccine. Shingles vaccine. Tetanus, diphtheria, and pertussis (Tdap) booster vaccine. Your health care provider may also recommend other immunizations. Tell your health care provider if you have ever been abused or do not feel safe at home. Summary Menopause is a normal process in which your ability to get pregnant comes to an end. This condition causes hot flashes, night sweats, decreased interest in sex, mood swings, headaches, or lack  of sleep. Treatment for this condition may include hormone replacement therapy. Take actions to keep yourself healthy, including exercising regularly, eating a healthy diet, watching your weight, and checking your blood pressure and blood sugar levels. Get screened for cancer and depression. Make sure that you are up to date with all your vaccines. This information is not intended to replace advice given to you by your health care provider. Make sure you discuss any questions you have with your health care provider. Document Revised: 06/15/2020 Document Reviewed: 06/15/2020 Elsevier Patient Education  2024 [employer].

## 2024-01-31 LAB — CBC
HCT: 45.2 % (ref 35.9–46.0)
Hemoglobin: 14.5 g/dL (ref 11.7–15.5)
MCH: 28.2 pg (ref 27.0–33.0)
MCHC: 32.1 g/dL (ref 31.6–35.4)
MCV: 87.9 fL (ref 81.4–101.7)
MPV: 9.8 fL (ref 7.5–12.5)
Platelets: 310 Thousand/uL (ref 140–400)
RBC: 5.14 Million/uL — ABNORMAL HIGH (ref 3.80–5.10)
RDW: 12.7 % (ref 11.0–15.0)
WBC: 5.6 Thousand/uL (ref 3.8–10.8)

## 2024-01-31 LAB — HEMOGLOBIN A1C
Hgb A1c MFr Bld: 5.6 %
Mean Plasma Glucose: 114 mg/dL
eAG (mmol/L): 6.3 mmol/L

## 2024-01-31 LAB — COMPREHENSIVE METABOLIC PANEL WITH GFR
AG Ratio: 1.7 (calc) (ref 1.0–2.5)
ALT: 17 U/L (ref 6–29)
AST: 24 U/L (ref 10–35)
Albumin: 4.3 g/dL (ref 3.6–5.1)
Alkaline phosphatase (APISO): 85 U/L (ref 37–153)
BUN: 14 mg/dL (ref 7–25)
CO2: 30 mmol/L (ref 20–32)
Calcium: 9.4 mg/dL (ref 8.6–10.4)
Chloride: 105 mmol/L (ref 98–110)
Creat: 0.79 mg/dL (ref 0.50–1.05)
Globulin: 2.6 g/dL (ref 1.9–3.7)
Glucose, Bld: 97 mg/dL (ref 65–99)
Potassium: 4.7 mmol/L (ref 3.5–5.3)
Sodium: 140 mmol/L (ref 135–146)
Total Bilirubin: 1 mg/dL (ref 0.2–1.2)
Total Protein: 6.9 g/dL (ref 6.1–8.1)
eGFR: 82 mL/min/1.73m2

## 2024-01-31 LAB — LIPID PANEL
Cholesterol: 228 mg/dL — ABNORMAL HIGH
HDL: 65 mg/dL
LDL Cholesterol (Calc): 141 mg/dL — ABNORMAL HIGH
Non-HDL Cholesterol (Calc): 163 mg/dL — ABNORMAL HIGH
Total CHOL/HDL Ratio: 3.5 (calc)
Triglycerides: 107 mg/dL

## 2024-02-02 ENCOUNTER — Ambulatory Visit: Payer: Self-pay | Admitting: Internal Medicine

## 2024-02-02 DIAGNOSIS — E7800 Pure hypercholesterolemia, unspecified: Secondary | ICD-10-CM

## 2024-02-02 MED ORDER — ATORVASTATIN CALCIUM 10 MG PO TABS: 10.0000 mg | ORAL_TABLET | Freq: Every day | ORAL | 1 refills | Status: AC

## 2024-02-23 NOTE — Progress Notes (Signed)
 Ashley Wallace                                          MRN: [id number]   02/23/2024   The [facility] Quality Team Specialist reviewed this patient medical record for the purposes of chart review for care gap closure. The following were reviewed: abstraction for care gap closure-controlling blood pressure.    [facility] Quality Team

## 2024-03-14 ENCOUNTER — Other Ambulatory Visit: Payer: Self-pay | Admitting: Internal Medicine

## 2024-03-15 NOTE — Telephone Encounter (Signed)
 Change of pharmacy  Requested Prescriptions  Pending Prescriptions Disp Refills   omeprazole  (PRILOSEC) 20 MG capsule [Pharmacy Med Name: OMEPRAZOLE  DR 20 MG CAPSULE] 90 capsule 1    Sig: TAKE 1 CAPSULE BY MOUTH EVERY DAY     Gastroenterology: Proton Pump Inhibitors Passed - 03/15/2024  1:48 PM      Passed - Valid encounter within last 12 months    Recent Outpatient Visits           1 month ago Encounter for general adult medical examination with abnormal findings   [facility] [place], Angeline ORN, NP   7 months ago Pre-diabetes   [facility] [place], Angeline ORN, [place]

## 2024-04-08 ENCOUNTER — Encounter

## 2024-05-02 ENCOUNTER — Other Ambulatory Visit

## 2024-07-30 ENCOUNTER — Ambulatory Visit: Admitting: Internal Medicine

## 2024-08-30 ENCOUNTER — Ambulatory Visit

## 2024-09-04 ENCOUNTER — Ambulatory Visit
